# Patient Record
Sex: Female | Born: 1945
Health system: Southern US, Community
[De-identification: ages and names within clinical notes are randomized; demographics above are authoritative.]

## PROBLEM LIST (undated history)

## (undated) DIAGNOSIS — K219 Gastro-esophageal reflux disease without esophagitis: Secondary | ICD-10-CM

## (undated) DIAGNOSIS — M1990 Unspecified osteoarthritis, unspecified site: Secondary | ICD-10-CM

## (undated) DIAGNOSIS — M8580 Other specified disorders of bone density and structure, unspecified site: Secondary | ICD-10-CM

## (undated) DIAGNOSIS — H9312 Tinnitus, left ear: Secondary | ICD-10-CM

## (undated) HISTORY — DX: Other specified disorders of bone density and structure, unspecified site: M85.80

## (undated) HISTORY — DX: Gastro-esophageal reflux disease without esophagitis: K21.9

## (undated) HISTORY — PX: CHOLECYSTECTOMY: SHX55

## (undated) HISTORY — DX: Tinnitus, left ear: H93.12

---

## 1976-07-07 HISTORY — PX: GASTRIC BYPASS: SHX52

## 2005-01-22 ENCOUNTER — Emergency Department: Payer: Self-pay | Admitting: Emergency Medicine

## 2005-02-24 ENCOUNTER — Encounter: Payer: Self-pay | Admitting: Specialist

## 2005-03-07 ENCOUNTER — Encounter: Payer: Self-pay | Admitting: Specialist

## 2005-04-06 ENCOUNTER — Encounter: Payer: Self-pay | Admitting: Specialist

## 2011-06-25 HISTORY — PX: COLONOSCOPY: SHX174

## 2014-09-25 DIAGNOSIS — G8929 Other chronic pain: Secondary | ICD-10-CM | POA: Diagnosis not present

## 2014-09-25 DIAGNOSIS — M545 Low back pain: Secondary | ICD-10-CM | POA: Diagnosis not present

## 2016-07-15 LAB — HM COLONOSCOPY

## 2016-07-23 ENCOUNTER — Encounter: Payer: Self-pay | Admitting: Family Medicine

## 2016-08-13 ENCOUNTER — Ambulatory Visit (INDEPENDENT_AMBULATORY_CARE_PROVIDER_SITE_OTHER): Payer: Medicare HMO | Admitting: Family Medicine

## 2016-08-13 VITALS — BP 138/72 | HR 83 | Temp 98.7°F | Resp 16 | Ht 61.0 in | Wt 189.5 lb

## 2016-08-13 DIAGNOSIS — Z0000 Encounter for general adult medical examination without abnormal findings: Secondary | ICD-10-CM | POA: Diagnosis not present

## 2016-08-13 DIAGNOSIS — Z23 Encounter for immunization: Secondary | ICD-10-CM

## 2016-08-13 DIAGNOSIS — Z9884 Bariatric surgery status: Secondary | ICD-10-CM | POA: Diagnosis not present

## 2016-08-13 DIAGNOSIS — E559 Vitamin D deficiency, unspecified: Secondary | ICD-10-CM | POA: Diagnosis not present

## 2016-08-13 DIAGNOSIS — Z1159 Encounter for screening for other viral diseases: Secondary | ICD-10-CM | POA: Diagnosis not present

## 2016-08-13 DIAGNOSIS — Z8781 Personal history of (healed) traumatic fracture: Secondary | ICD-10-CM | POA: Insufficient documentation

## 2016-08-13 DIAGNOSIS — Z1231 Encounter for screening mammogram for malignant neoplasm of breast: Secondary | ICD-10-CM | POA: Diagnosis not present

## 2016-08-13 DIAGNOSIS — K219 Gastro-esophageal reflux disease without esophagitis: Secondary | ICD-10-CM | POA: Diagnosis not present

## 2016-08-13 DIAGNOSIS — E88819 Insulin resistance, unspecified: Secondary | ICD-10-CM | POA: Insufficient documentation

## 2016-08-13 DIAGNOSIS — M170 Bilateral primary osteoarthritis of knee: Secondary | ICD-10-CM

## 2016-08-13 DIAGNOSIS — H9312 Tinnitus, left ear: Secondary | ICD-10-CM | POA: Diagnosis not present

## 2016-08-13 DIAGNOSIS — Z1239 Encounter for other screening for malignant neoplasm of breast: Secondary | ICD-10-CM

## 2016-08-13 DIAGNOSIS — E8881 Metabolic syndrome: Secondary | ICD-10-CM | POA: Diagnosis not present

## 2016-08-13 DIAGNOSIS — E2839 Other primary ovarian failure: Secondary | ICD-10-CM | POA: Diagnosis not present

## 2016-08-13 MED ORDER — LANSOPRAZOLE 15 MG PO CPDR: 15.0000 mg | DELAYED_RELEASE_CAPSULE | Freq: Every day | ORAL | 0 refills | Status: DC

## 2016-08-13 NOTE — Progress Notes (Signed)
Name: Heather Norris   MRN: [id number]    DOB: 1945-10-07   Date:08/13/2016       Progress Note  Subjective  Chief Complaint  Chief Complaint  Patient presents with  . Annual Exam    HPI  Functional ability/safety issues: No Issues Hearing issues: Addressed  Activities of daily living: Discussed Home safety issues: No Issues  End Of Life Planning: Offered verbal information regarding advanced directives, healthcare power of attorney. She will get it done Preventative care, Health maintenance, Preventative health measures discussed.  Preventative screenings discussed today: lab work, colonoscopy,  mammogram, DEXA.  Low Dose CT Chest recommended if Age 38-80 years, 30 pack-year currently smoking OR have quit w/in 15years. She wants to think about it  Lifestyle risk factor issued reviewed: Diet, exercise, weight management, advised patient smoking is not healthy, nutrition/diet.  Preventative health measures discussed (5-10 year plan).  Reviewed and recommended vaccinations: - Pneumovax  - Prevnar  - Annual Influenza - Zostavax - Tdap   Depression screening: Done Fall risk screening: Done Discuss ADLs/IADLs: Done  Current medical providers: See HPI  Other health risk factors identified this visit: No other issues Cognitive impairment issues: None identified  All above discussed with patient. Appropriate education, counseling and referral will be made based upon the above.   Bariatric Surgery: many years ago, not taking any supplements, but feels well, we will check labs  GERD: doing well at this time, she denies heart burn or indigestion, also denies regurgitation. Discussed risk of long term use of PPI. She states she is currently only taking it prn   Tinnitus left ear: she denies hearing loss, but is unilateral, discussed referral to ENT and she is willing to go.   Patient Active Problem List   Diagnosis Date Noted  . GERD (gastroesophageal reflux disease)  08/13/2016  . Tinnitus of left ear 08/13/2016  . History of bariatric surgery 08/13/2016  . Vitamin D deficiency 08/13/2016  . History of shoulder fracture 08/13/2016  . Osteoarthritis of both knees 08/13/2016  . Insulin resistance 08/13/2016    Past Surgical History:  Procedure Laterality Date  . CHOLECYSTECTOMY    . COLONOSCOPY  06/25/2011  . GASTRIC BYPASS  1978    Family History  Problem Relation Age of Onset  . Adopted: Yes    Social History   Social History  . Marital status: Widowed    Spouse name: N/A  . Number of children: N/A  . Years of education: N/A   Occupational History  . Not on file.   Social History Main Topics  . Smoking status: Former Smoker    Packs/day: 2.00    Years: 55.00    Types: Cigarettes    Start date: 07/07/1956    Quit date: 12/06/2011  . Smokeless tobacco: Never Used     Comment: Patient has smoke off and on for 55 years  . Alcohol use No  . Drug use: No  . Sexual activity: Not Currently   Other Topics Concern  . Not on file   Social History Narrative  . No narrative on file     Current Outpatient Prescriptions:  .  lansoprazole (PREVACID) 15 MG capsule, Take 1 capsule (15 mg total) by mouth daily at 12 noon., Disp: 30 capsule, Rfl: 0  No Known Allergies   ROS  Constitutional: Negative for fever or significant  weight change.  Respiratory: Negative for cough and shortness of breath.   Cardiovascular: Negative for chest pain or palpitations.  Gastrointestinal: Negative for abdominal pain, no bowel changes.  Musculoskeletal: Negative for gait problem or  joint swelling.  Skin: Negative for rash.  Neurological: Negative for dizziness or headache.  No other specific complaints in a complete review of systems (except as listed in HPI above).  Objective  Vitals:   08/13/16 1043  BP: 138/72  Pulse: 83  Resp: 16  Temp: 98.7 F (37.1 C)  TempSrc: Oral  SpO2: 96%  Weight: 189 lb 8 oz (86 kg)  Height: 5\' 1"  (1.549 m)     Body mass index is 35.81 kg/m.  Physical Exam  Constitutional: Patient appears well-developed and obese. No distress.  HENT: Head: Normocephalic and atraumatic. Ears: B TMs ok, no erythema or effusion; Nose: Nose normal. Mouth/Throat: Oropharynx is clear and moist. No oropharyngeal exudate.  Eyes: Conjunctivae and EOM are normal. Pupils are equal, round, and reactive to light. No scleral icterus.  Neck: Normal range of motion. Neck supple. No JVD present. No thyromegaly present.  Cardiovascular: Normal rate, regular rhythm and normal heart sounds.  No murmur heard. Trace  BLE edema. Pulmonary/Chest: Effort normal and breath sounds normal. No respiratory distress. Abdominal: Soft. Bowel sounds are normal, no distension. There is no tenderness. no masses Breast: no lumps or masses, no nipple discharge or rashes FEMALE GENITALIA:  Not examed RECTAL not done Musculoskeletal: Decrease rom of right shoulder, no joint effusions. No gross deformities Neurological: he is alert and oriented to person, place, and time. No cranial nerve deficit. Coordination, balance, strength, speech and gait are normal.  Skin: Skin is warm and dry. No rash noted. No erythema.  Psychiatric: Patient has a normal mood and affect. behavior is normal. Judgment and thought content normal.   PHQ2/9: Depression screen PHQ 2/9 08/13/2016  Decreased Interest 0  Down, Depressed, Hopeless 0  PHQ - 2 Score 0     Fall Risk: Fall Risk  08/13/2016  Falls in the past year? No    Functional Status Survey: Is the patient deaf or have difficulty hearing?: No Does the patient have difficulty seeing, even when wearing glasses/contacts?: No Does the patient have difficulty concentrating, remembering, or making decisions?: No Does the patient have difficulty walking or climbing stairs?: No Does the patient have difficulty dressing or bathing?: No Does the patient have difficulty doing errands alone such as visiting a  doctor's office or shopping?: No   Assessment & Plan  1. Medicare annual wellness visit, subsequent  Discussed importance of 150 minutes of physical activity weekly, eat two servings of fish weekly, eat one serving of tree nuts ( cashews, pistachios, pecans, almonds.Marland Kitchen) every other day, eat 6 servings of fruit/vegetables daily and drink plenty of water and avoid sweet beverages.  - COMPLETE METABOLIC PANEL WITH GFR  2. Needs flu shot  refused  3. Gastroesophageal reflux disease without esophagitis  - lansoprazole (PREVACID) 15 MG capsule; Take 1 capsule (15 mg total) by mouth daily at 12 noon.  Dispense: 30 capsule; Refill: 0  4. History of bariatric surgery  - Vitamin B12 - CBC with Differential/Platelet - COMPLETE METABOLIC PANEL WITH GFR  5. Tinnitus of left ear  - Ambulatory referral to ENT  6. Vitamin D deficiency  - VITAMIN D 25 Hydroxy (Vit-D Deficiency, Fractures) - COMPLETE METABOLIC PANEL WITH GFR  7. History of shoulder fracture  Still has decrease rom of right shoulder but not pain   8. Primary osteoarthritis of both knees  Advised Tylenol prn   9. Insulin resistance  - Hemoglobin  A1c - Insulin, fasting - COMPLETE METABOLIC PANEL WITH GFR  10. Need for pneumococcal vaccination  - Pneumococcal conjugate vaccine 13-valent IM  11. Need for shingles vaccine  - Varicella-zoster vaccine subcutaneous  12. Encounter for screening breast examination  - mammogram   13. Ovarian failure  - DG Bone Density; Future  14. Need for hepatitis C screening test  - Hepatitis C antibody

## 2016-08-14 LAB — COMPLETE METABOLIC PANEL WITH GFR
ALT: 6 U/L (ref 6–29)
AST: 17 U/L (ref 10–35)
Albumin: 4.2 g/dL (ref 3.6–5.1)
Alkaline Phosphatase: 64 U/L (ref 33–130)
BUN: 13 mg/dL (ref 7–25)
CHLORIDE: 109 mmol/L (ref 98–110)
CO2: 23 mmol/L (ref 20–31)
Calcium: 9.2 mg/dL (ref 8.6–10.4)
Creat: 0.98 mg/dL — ABNORMAL HIGH (ref 0.60–0.93)
GFR, EST AFRICAN AMERICAN: 67 mL/min (ref >=60)
GFR, EST NON AFRICAN AMERICAN: 58 mL/min — AB (ref >=60)
Glucose, Bld: 101 mg/dL — ABNORMAL HIGH (ref 65–99)
Potassium: 4.1 mmol/L (ref 3.5–5.3)
Sodium: 142 mmol/L (ref 135–146)
Total Bilirubin: 0.7 mg/dL (ref 0.2–1.2)
Total Protein: 6.8 g/dL (ref 6.1–8.1)

## 2016-08-14 LAB — CBC WITH DIFFERENTIAL/PLATELET
BASOS PCT: 1 %
Basophils Absolute: 50 cells/uL (ref 0–200)
Eosinophils Absolute: 50 cells/uL (ref 15–500)
Eosinophils Relative: 1 %
HEMATOCRIT: 41.3 % (ref 35.0–45.0)
Hemoglobin: 13.5 g/dL (ref 11.7–15.5)
LYMPHS PCT: 32 %
Lymphs Abs: 1600 cells/uL (ref 850–3900)
MCH: 29 pg (ref 27.0–33.0)
MCHC: 32.7 g/dL (ref 32.0–36.0)
MCV: 88.6 fL (ref 80.0–100.0)
MONO ABS: 350 {cells}/uL (ref 200–950)
MPV: 10.3 fL (ref 7.5–12.5)
Monocytes Relative: 7 %
NEUTROS ABS: 2950 {cells}/uL (ref 1500–7800)
Neutrophils Relative %: 59 %
PLATELETS: 242 10*3/uL (ref 140–400)
RBC: 4.66 MIL/uL (ref 3.80–5.10)
RDW: 14.2 % (ref 11.0–15.0)
WBC: 5 10*3/uL (ref 3.8–10.8)

## 2016-08-14 LAB — INSULIN, FASTING: INSULIN FASTING, SERUM: 6.1 u[IU]/mL (ref 2.0–19.6)

## 2016-08-14 LAB — HEMOGLOBIN A1C
Hgb A1c MFr Bld: 5.4 % (ref <5.7)
MEAN PLASMA GLUCOSE: 108 mg/dL

## 2016-08-14 LAB — HEPATITIS C ANTIBODY: HCV Ab: REACTIVE — AB

## 2016-08-14 LAB — VITAMIN B12: Vitamin B-12: 273 pg/mL (ref 200–1100)

## 2016-08-14 LAB — VITAMIN D 25 HYDROXY (VIT D DEFICIENCY, FRACTURES): Vit D, 25-Hydroxy: 21 ng/mL — ABNORMAL LOW (ref 30–100)

## 2016-08-15 ENCOUNTER — Other Ambulatory Visit: Payer: Self-pay | Admitting: Family Medicine

## 2016-08-15 DIAGNOSIS — R799 Abnormal finding of blood chemistry, unspecified: Secondary | ICD-10-CM

## 2016-08-18 ENCOUNTER — Encounter: Payer: Self-pay | Admitting: Family Medicine

## 2016-08-20 LAB — HEPATITIS C VRS RNA DETECT BY PCR-QUAL: Hepatitis C Vrs RNA by PCR-Qual: NOT DETECTED

## 2016-08-21 LAB — HEPATITIS C RNA QUANTITATIVE
HCV Quantitative Log: <1.18 Log IU/mL
HCV Quantitative: <15 IU/mL

## 2016-09-02 DIAGNOSIS — H9319 Tinnitus, unspecified ear: Secondary | ICD-10-CM | POA: Diagnosis not present

## 2016-09-02 DIAGNOSIS — H903 Sensorineural hearing loss, bilateral: Secondary | ICD-10-CM | POA: Diagnosis not present

## 2016-09-23 ENCOUNTER — Ambulatory Visit
Admission: RE | Admit: 2016-09-23 | Discharge: 2016-09-23 | Disposition: A | Discharge status: Home / Self Care | Payer: Medicare HMO | Source: Ambulatory Visit | Attending: Family Medicine | Admitting: Family Medicine

## 2016-09-23 DIAGNOSIS — M8588 Other specified disorders of bone density and structure, other site: Secondary | ICD-10-CM | POA: Diagnosis not present

## 2016-09-23 DIAGNOSIS — Z1231 Encounter for screening mammogram for malignant neoplasm of breast: Secondary | ICD-10-CM | POA: Diagnosis not present

## 2016-09-23 DIAGNOSIS — Z1239 Encounter for other screening for malignant neoplasm of breast: Secondary | ICD-10-CM

## 2016-09-23 DIAGNOSIS — M85852 Other specified disorders of bone density and structure, left thigh: Secondary | ICD-10-CM | POA: Diagnosis not present

## 2016-09-23 DIAGNOSIS — E2839 Other primary ovarian failure: Secondary | ICD-10-CM

## 2016-09-25 ENCOUNTER — Encounter: Payer: Self-pay | Admitting: Family Medicine

## 2016-09-25 DIAGNOSIS — M8580 Other specified disorders of bone density and structure, unspecified site: Secondary | ICD-10-CM | POA: Insufficient documentation

## 2017-08-11 ENCOUNTER — Ambulatory Visit (INDEPENDENT_AMBULATORY_CARE_PROVIDER_SITE_OTHER): Payer: Medicare HMO

## 2017-08-11 VITALS — BP 138/88 | HR 74 | Temp 98.2°F | Resp 12 | Ht 61.0 in | Wt 180.2 lb

## 2017-08-11 DIAGNOSIS — Z1231 Encounter for screening mammogram for malignant neoplasm of breast: Secondary | ICD-10-CM

## 2017-08-11 DIAGNOSIS — Z1239 Encounter for other screening for malignant neoplasm of breast: Secondary | ICD-10-CM

## 2017-08-11 DIAGNOSIS — Z0000 Encounter for general adult medical examination without abnormal findings: Secondary | ICD-10-CM | POA: Diagnosis not present

## 2017-08-11 NOTE — Progress Notes (Signed)
Subjective:   Heather Norris is a 72 y.o. female who presents for Medicare Annual (Subsequent) preventive examination.  Review of Systems:  N/A Cardiac Risk Factors include: advanced age (>39men, >82 women);sedentary lifestyle;obesity (BMI >30kg/m2)     Objective:     Vitals: BP 138/88 (BP Location: Left Arm, Patient Position: Sitting, Cuff Size: Large)   Pulse 74   Temp 98.2 F (36.8 C) (Oral)   Resp 12   Ht 5\' 1"  (1.549 m)   Wt 180 lb 3.2 oz (81.7 kg)   BMI 34.05 kg/m   Body mass index is 34.05 kg/m.  Advanced Directives 08/11/2017 08/13/2016  Does Patient Have a Medical Advance Directive? No No  Would patient like information on creating a medical advance directive? Yes (MAU/Ambulatory/Procedural Areas - Information given) -   Pt has been provided with the "MOST" and "DNR" documents for her review. Pt has been advised that she will only need to complete the document that is most appropriate to suit her health care wishes. Once completed, pt has been advised to return the appropriate document to the office for the physician to review and sign. Verbalized acceptance and understanding.  Tobacco Social History   Tobacco Use  Smoking Status Former Smoker  . Packs/day: 2.00  . Years: 55.00  . Pack years: 110.00  . Types: Cigarettes  . Start date: 07/07/1956  . Last attempt to quit: 12/06/2011  . Years since quitting: 5.6  Smokeless Tobacco Never Used  Tobacco Comment   smoking cessation materials not required     Counseling given: No Comment: smoking cessation materials not required   Clinical Intake:  Pre-visit preparation completed: Yes  Pain : No/denies pain   BMI - recorded: 34.05 Nutritional Status: BMI > 30  Obese Nutritional Risks: None Diabetes: No  How often do you need to have someone help you when you read instructions, pamphlets, or other written materials from your doctor or pharmacy?: 1 - Never  Interpreter Needed?: No  Information entered by  :: AEversole, LPN  Past Medical History:  Diagnosis Date  . GERD (gastroesophageal reflux disease)   . Tinnitus of left ear    Past Surgical History:  Procedure Laterality Date  . CHOLECYSTECTOMY    . COLONOSCOPY  06/25/2011  . GASTRIC BYPASS  1978   Family History  Adopted: Yes  Problem Relation Age of Onset  . Mental illness Daughter        anxiety    Social History   Socioeconomic History  . Marital status: Widowed    Spouse name: Heather Norris  . Number of children: 1  . Years of education: None  . Highest education level: 7th grade  Social Needs  . Financial resource strain: Not hard at all  . Food insecurity - worry: Never true  . Food insecurity - inability: Never true  . Transportation needs - medical: No  . Transportation needs - non-medical: No  Occupational History  . Occupation: Retired  Tobacco Use  . Smoking status: Former Smoker    Packs/day: 2.00    Years: 55.00    Pack years: 110.00    Types: Cigarettes    Start date: 07/07/1956    Last attempt to quit: 12/06/2011    Years since quitting: 5.6  . Smokeless tobacco: Never Used  . Tobacco comment: smoking cessation materials not required  Substance and Sexual Activity  . Alcohol use: No  . Drug use: No  . Sexual activity: Not Currently  Other Topics Concern  .  None  Social History Narrative  . None    Outpatient Encounter Medications as of 08/11/2017  Medication Sig  . lansoprazole (PREVACID) 15 MG capsule Take 1 capsule (15 mg total) by mouth daily at 12 noon.   No facility-administered encounter medications on file as of 08/11/2017.     Activities of Daily Living In your present state of health, do you have any difficulty performing the following activities: 08/11/2017 08/13/2016  Hearing? Y N  Comment Tinnitus in L ear; denies wearing hearing aids -  Vision? N N  Comment wears eyeglasses -  Difficulty concentrating or making decisions? N N  Walking or climbing stairs? Y N  Comment joint pain -    Dressing or bathing? N N  Doing errands, shopping? N N  Preparing Food and eating ? N -  Comment denies wearing dentures -  Using the Toilet? N -  In the past six months, have you accidently leaked urine? N -  Do you have problems with loss of bowel control? N -  Managing your Medications? N -  Managing your Finances? N -  Housekeeping or managing your Housekeeping? N -  Some recent data might be hidden    Patient Care Team: Heather Sizer, MD as PCP - General (Family Medicine)    Assessment:   This is a routine wellness examination for [name].  Exercise Activities and Dietary recommendations Current Exercise Habits: The patient does not participate in regular exercise at present, Exercise limited by: None identified  Goals    . DIET - INCREASE WATER INTAKE     Recommend to drink at least 6-8 8oz glasses of water per day.       Fall Risk Fall Risk  08/11/2017 08/13/2016  Falls in the past year? No No   Is the patient's home free of loose throw rugs in walkways, pet beds, electrical cords, etc?   Yes Does the patient have any grab bars in the bathroom? No  Does the patient use a shower chair when bathing? No Does the patient have any stairs in or around the home? No If so, are there any handrails?  N/A, has a ramp Does the patient have adequate lighting?  Yes Does the patient use a cane, walker or w/c? No Does the patient use of an elevated toilet seat? No  Timed Get Up and Go Performed: Yes. Pt ambulated 10 feet within 12 sec. Gait stead-fast and without the use of an assistive device. No intervention required at this time. Fall risk prevention has been discussed.  Pt declined my offer to send Community Resource Referral to Care Guide for installation of grab bars in the shower, shower chair or an elevated toilet seat.  Depression Screen PHQ 2/9 Scores 08/11/2017 08/13/2016  PHQ - 2 Score 0 0     Cognitive Function     6CIT Screen 08/11/2017  What Year? 0 points  What  month? 0 points  What time? 0 points  Count back from 20 0 points  Months in reverse 2 points  Repeat phrase 4 points  Total Score 6    Immunization History  Administered Date(s) Administered  . Pneumococcal Conjugate-13 08/13/2016  . Pneumococcal-Unspecified 06/11/2011  . Td 06/11/2011    Qualifies for Shingles Vaccine? Yes. Due for Zostavax or Shingrix vaccine. Education has been provided regarding the importance of this vaccine. Pt has been advised to call her insurance company to determine her out of pocket expense. Advised she may also receive this vaccine  at his/her local pharmacy or Health Dept. Verbalized acceptance and understanding.  Due for Flu vaccine. Declined my offer to administer today. Education has been provided regarding the importance of this vaccine but still declined. Pt has been advised to call our office if she should change her mind and decide to receive this vaccine. Also advised she may also receive this vaccine at her local pharmacy or Health Dept. Verbalized acceptance and understanding.  Screening Tests Health Maintenance  Topic Date Due  . INFLUENZA VACCINE  04/13/2018 (Originally 02/04/2017)  . MAMMOGRAM  09/23/2017  . TETANUS/TDAP  06/10/2021  . COLONOSCOPY  07/15/2026  . DEXA SCAN  Completed  . Hepatitis C Screening  Completed  . PNA vac Low Risk Adult  Completed    Cancer Screenings: Lung: Low Dose CT Chest recommended if Age 18-80 years, 30 pack-year currently smoking OR have quit w/in 15years. Patient does qualify. An Epic message has been sent to Burgess Estelle, RN (Oncology Nurse Navigator) regarding the possible need for this exam. Raquel Sarna will review the patient's chart and review all previous imaging, then will collaborate with radiology to determine if the patient truly qualifies for the exam. If the patient qualifies, Raquel Sarna will order the Low Dose CT of the chest and will call the patient to schedule the exam. Breast:  Up to date on Mammogram?  Yes. Completed 09/23/16. Repeat every year. Ordered today. The patient has been provided with contact information to schedule her own appointment. Advised to schedule on or after 09/23/17. Verbalized acceptance and understanding. Up to date of Bone Density/Dexa? Yes. Completed 09/23/16. Osteoporotic screenings no longer required. Colorectal: Completed colonoscopy 07/15/16. Repeat every 10 years  Additional Screenings: Hepatitis B/HIV/Syphillis: Does not qualify Hepatitis C Screening: Completed 08/13/16      Plan:  I have personally reviewed and addressed the Medicare Annual Wellness questionnaire and have noted the following in the patient's chart:  A. Medical and social history B. Use of alcohol, tobacco or illicit drugs  C. Current medications and supplements D. Functional ability and status E.  Nutritional status F.  Physical activity G. Advance directives H. List of other physicians I.  Hospitalizations, surgeries, and ER visits in previous 12 months J.  [place] such as hearing and vision if needed, cognitive and depression L. Referrals and appointments - none  In addition, I have reviewed and discussed with patient certain preventive protocols, quality metrics, and best practice recommendations. A written personalized care plan for preventive services as well as general preventive health recommendations were provided to patient.  Signed,  Aleatha Borer, LPN Nurse Health Advisor  I have reviewed this encounter including the documentation in this note and/or discussed this patient with the provider, Aleatha Borer, LPN. I am certifying that I agree with the content of this note as supervising physician.  Heather Sizer, MD [facility] 08/12/2017, 3:15 PM

## 2017-08-11 NOTE — Patient Instructions (Signed)
[name] , Thank you for taking time to come for your Medicare Wellness Visit. I appreciate your ongoing commitment to your health goals. Please review the following plan we discussed and let me know if I can assist you in the future.   Screening recommendations/referrals: Colorectal Screening: Completed colonoscopy 07/15/16. Repeat every 10 years Mammogram: Completed 09/23/16. Repeat every year.  Bone Density: Completed 09/23/16. Osteoporotic screenings no longer required. Lung Cancer Screening: You will receive a call from Burgess Estelle, RN to schedule your CT scan. Hepatitis C Screening: Completed 08/13/16 HIV/Syphilis/Hepatitis B Screening: You do not qualify for this screening   Vision/Dental Exams: Recommended yearly ophthalmology/optometry visit for glaucoma screening and checkup Recommended yearly dental visit for hygiene and checkup  Vaccinations: Influenza vaccine: Declined Pneumococcal vaccine: Completed PCV13 08/13/16 and PPSV23 06/11/11 Tdap vaccine: Completed 06/11/11 Shingles vaccine: Please call your insurance company to determine your out of pocket expense for the Shingrix vaccine. You may also receive this vaccine at your local pharmacy or Health Dept.  Advanced directives: Advance directive discussed with you today. I have provided a copy for you to complete at home and have notarized. Once this is complete please bring a copy in to our office so we can scan it into your chart.  Conditions/risks identified: Recommend to drink at least 6-8 8oz glasses of water per day.  Next appointment: You are scheduled to see Dr. Ancil Boozer on 08/17/17 @ 11:00am.   Please schedule your Annual Wellness Visit with your Nurse Health Advisor in one year.  Preventive Care 74 Years and Older, Female Preventive care refers to lifestyle choices and visits with your health care provider that can promote health and wellness. What does preventive care include?  A yearly physical exam. This is also  called an annual well check.  Dental exams once or twice a year.  Routine eye exams. Ask your health care provider how often you should have your eyes checked.  Personal lifestyle choices, including:  Daily care of your teeth and gums.  Regular physical activity.  Eating a healthy diet.  Avoiding tobacco and drug use.  Limiting alcohol use.  Practicing safe sex.  Taking low-dose aspirin every day.  Taking vitamin and mineral supplements as recommended by your health care provider. What happens during an annual well check? The services and screenings done by your health care provider during your annual well check will depend on your age, overall health, lifestyle risk factors, and family history of disease. Counseling  Your health care provider may ask you questions about your:  Alcohol use.  Tobacco use.  Drug use.  Emotional well-being.  Home and relationship well-being.  Sexual activity.  Eating habits.  History of falls.  Memory and ability to understand (cognition).  Work and work Statistician.  Reproductive health. Screening  You may have the following tests or measurements:  Height, weight, and BMI.  Blood pressure.  Lipid and cholesterol levels. These may be checked every 5 years, or more frequently if you are over 62 years old.  Skin check.  Lung cancer screening. You may have this screening every year starting at age 74 if you have a 30-pack-year history of smoking and currently smoke or have quit within the past 15 years.  Fecal occult blood test (FOBT) of the stool. You may have this test every year starting at age 74.  Flexible sigmoidoscopy or colonoscopy. You may have a sigmoidoscopy every 5 years or a colonoscopy every 10 years starting at age 74.  Hepatitis  C blood test.  Hepatitis B blood test.  Sexually transmitted disease (STD) testing.  Diabetes screening. This is done by checking your blood sugar (glucose) after you have not  eaten for a while (fasting). You may have this done every 1-3 years.  Bone density scan. This is done to screen for osteoporosis. You may have this done starting at age 74.  Mammogram. This may be done every 1-2 years. Talk to your health care provider about how often you should have regular mammograms. Talk with your health care provider about your test results, treatment options, and if necessary, the need for more tests. Vaccines  Your health care provider may recommend certain vaccines, such as:  Influenza vaccine. This is recommended every year.  Tetanus, diphtheria, and acellular pertussis (Tdap, Td) vaccine. You may need a Td booster every 10 years.  Zoster vaccine. You may need this after age 74.  Pneumococcal 13-valent conjugate (PCV13) vaccine. One dose is recommended after age 74.  Pneumococcal polysaccharide (PPSV23) vaccine. One dose is recommended after age 74. Talk to your health care provider about which screenings and vaccines you need and how often you need them. This information is not intended to replace advice given to you by your health care provider. Make sure you discuss any questions you have with your health care provider. Document Released: 07/20/2015 Document Revised: 03/12/2016 Document Reviewed: 04/24/2015 Elsevier Interactive Patient Education  2017 [place] Prevention in the Home Falls can cause injuries. They can happen to people of all ages. There are many things you can do to make your home safe and to help prevent falls. What can I do on the outside of my home?  Regularly fix the edges of walkways and driveways and fix any cracks.  Remove anything that might make you trip as you walk through a door, such as a raised step or threshold.  Trim any bushes or trees on the path to your home.  Use bright outdoor lighting.  Clear any walking paths of anything that might make someone trip, such as rocks or tools.  Regularly check to see if  handrails are loose or broken. Make sure that both sides of any steps have handrails.  Any raised decks and porches should have guardrails on the edges.  Have any leaves, snow, or ice cleared regularly.  Use sand or salt on walking paths during winter.  Clean up any spills in your garage right away. This includes oil or grease spills. What can I do in the bathroom?  Use night lights.  Install grab bars by the toilet and in the tub and shower. Do not use towel bars as grab bars.  Use non-skid mats or decals in the tub or shower.  If you need to sit down in the shower, use a plastic, non-slip stool.  Keep the floor dry. Clean up any water that spills on the floor as soon as it happens.  Remove soap buildup in the tub or shower regularly.  Attach bath mats securely with double-sided non-slip rug tape.  Do not have throw rugs and other things on the floor that can make you trip. What can I do in the bedroom?  Use night lights.  Make sure that you have a light by your bed that is easy to reach.  Do not use any sheets or blankets that are too big for your bed. They should not hang down onto the floor.  Have a firm chair that has side arms.  You can use this for support while you get dressed.  Do not have throw rugs and other things on the floor that can make you trip. What can I do in the kitchen?  Clean up any spills right away.  Avoid walking on wet floors.  Keep items that you use a lot in easy-to-reach places.  If you need to reach something above you, use a strong step stool that has a grab bar.  Keep electrical cords out of the way.  Do not use floor polish or wax that makes floors slippery. If you must use wax, use non-skid floor wax.  Do not have throw rugs and other things on the floor that can make you trip. What can I do with my stairs?  Do not leave any items on the stairs.  Make sure that there are handrails on both sides of the stairs and use them. Fix  handrails that are broken or loose. Make sure that handrails are as long as the stairways.  Check any carpeting to make sure that it is firmly attached to the stairs. Fix any carpet that is loose or worn.  Avoid having throw rugs at the top or bottom of the stairs. If you do have throw rugs, attach them to the floor with carpet tape.  Make sure that you have a light switch at the top of the stairs and the bottom of the stairs. If you do not have them, ask someone to add them for you. What else can I do to help prevent falls?  Wear shoes that:  Do not have high heels.  Have rubber bottoms.  Are comfortable and fit you well.  Are closed at the toe. Do not wear sandals.  If you use a stepladder:  Make sure that it is fully opened. Do not climb a closed stepladder.  Make sure that both sides of the stepladder are locked into place.  Ask someone to hold it for you, if possible.  Clearly mark and make sure that you can see:  Any grab bars or handrails.  First and last steps.  Where the edge of each step is.  Use tools that help you move around (mobility aids) if they are needed. These include:  Canes.  Walkers.  Scooters.  Crutches.  Turn on the lights when you go into a dark area. Replace any light bulbs as soon as they burn out.  Set up your furniture so you have a clear path. Avoid moving your furniture around.  If any of your floors are uneven, fix them.  If there are any pets around you, be aware of where they are.  Review your medicines with your doctor. Some medicines can make you feel dizzy. This can increase your chance of falling. Ask your doctor what other things that you can do to help prevent falls. This information is not intended to replace advice given to you by your health care provider. Make sure you discuss any questions you have with your health care provider. Document Released: 04/19/2009 Document Revised: 11/29/2015 Document Reviewed:  07/28/2014 Elsevier Interactive Patient Education  2017 [employer].

## 2017-08-12 ENCOUNTER — Telehealth: Payer: Self-pay | Admitting: *Deleted

## 2017-08-12 NOTE — Telephone Encounter (Signed)
Received referral for low dose lung cancer screening CT scan. Attempted to leave Message at phone number listed in EMR for patient to call me back to facilitate scheduling scan. However, this option is not available. I will attempt to contact her at a later date.

## 2017-08-17 ENCOUNTER — Encounter: Payer: Self-pay | Admitting: Family Medicine

## 2017-08-17 ENCOUNTER — Ambulatory Visit (INDEPENDENT_AMBULATORY_CARE_PROVIDER_SITE_OTHER): Payer: Medicare HMO | Admitting: Family Medicine

## 2017-08-17 ENCOUNTER — Telehealth: Payer: Self-pay | Admitting: *Deleted

## 2017-08-17 VITALS — BP 118/84 | HR 84 | Temp 97.8°F | Resp 18 | Ht 61.88 in | Wt 176.3 lb

## 2017-08-17 DIAGNOSIS — Z9884 Bariatric surgery status: Secondary | ICD-10-CM | POA: Diagnosis not present

## 2017-08-17 DIAGNOSIS — Z122 Encounter for screening for malignant neoplasm of respiratory organs: Secondary | ICD-10-CM

## 2017-08-17 DIAGNOSIS — Z87891 Personal history of nicotine dependence: Secondary | ICD-10-CM

## 2017-08-17 DIAGNOSIS — Z1231 Encounter for screening mammogram for malignant neoplasm of breast: Secondary | ICD-10-CM

## 2017-08-17 DIAGNOSIS — M8580 Other specified disorders of bone density and structure, unspecified site: Secondary | ICD-10-CM | POA: Diagnosis not present

## 2017-08-17 DIAGNOSIS — Z01419 Encounter for gynecological examination (general) (routine) without abnormal findings: Secondary | ICD-10-CM | POA: Diagnosis not present

## 2017-08-17 DIAGNOSIS — E559 Vitamin D deficiency, unspecified: Secondary | ICD-10-CM

## 2017-08-17 DIAGNOSIS — Z1239 Encounter for other screening for malignant neoplasm of breast: Secondary | ICD-10-CM

## 2017-08-17 DIAGNOSIS — R202 Paresthesia of skin: Secondary | ICD-10-CM | POA: Diagnosis not present

## 2017-08-17 MED ORDER — MULTI-VITAMIN/MINERALS PO TABS: 1.0000 | ORAL_TABLET | Freq: Every day | ORAL | 0 refills | Status: DC

## 2017-08-17 MED ORDER — ALENDRONATE SODIUM 70 MG PO TABS: 70.0000 mg | ORAL_TABLET | ORAL | 3 refills | Status: DC

## 2017-08-17 MED ORDER — CALCIUM CARBONATE-VITAMIN D 500-200 MG-UNIT PO TABS: 1.0000 | ORAL_TABLET | Freq: Two times a day (BID) | ORAL | 0 refills | Status: DC

## 2017-08-17 NOTE — Progress Notes (Signed)
Name: Heather Norris   MRN: [id number]    DOB: 1945/11/09   Date:08/17/2017       Progress Note  Subjective  Chief Complaint  Chief Complaint  Patient presents with  . Medicare Wellness    HPI   Patient presents for annual CPE and follow up  She is healthy and feels well, except for paresthesia on both feet, initially only on toes, but is progressing to mid foot, constant , it feels like she has a tight pair of socks on. No rashes. She had bariatric surgery many years ago, not taking any supplements. Explained that it may be B12 deficiency, we will check labs. Advised to start taking at least a multi vitamin daily .   Osteopenia: with a very high FRAX score, discussed medications and she agrees in starting, discussed options, and she would like oral medication for now  USPSTF grade A and B recommendations  Depression:  Depression screen [facility] 2/9 08/17/2017 08/11/2017 08/13/2016  Decreased Interest 0 0 0  Down, Depressed, Hopeless 0 0 0  PHQ - 2 Score 0 0 0   Hypertension: BP Readings from Last 3 Encounters:  08/17/17 118/84  08/11/17 138/88  08/13/16 138/72   Obesity: Wt Readings from Last 3 Encounters:  08/17/17 176 lb 4.8 oz (80 kg)  08/11/17 180 lb 3.2 oz (81.7 kg)  08/13/16 189 lb 8 oz (86 kg)   BMI Readings from Last 3 Encounters:  08/17/17 32.38 kg/m  08/11/17 34.05 kg/m  08/13/16 35.81 kg/m     Sexual History/Pain during Intercourse: not in many years Menstrual History/LMP/Abnormal Bleeding: not sexually active and denies post-menopausal bleeding Incontinence Symptoms: none   Advanced Care Planning: A voluntary discussion about advance care planning including the explanation and discussion of advance directives.  Discussed health care proxy and Living will, and the patient was able to identify a health care proxy as daughter   Patient does not have a living will at present time. If patient does have living will, I have requested they bring this to the clinic to  be scanned in to their chart.  Breast cancer:  She would like to have it every other year Cervical cancer screening:not a candidate  Osteoporosis:  Advised medication but she refuses  Lipids:  No results found for: CHOL No results found for: HDL No results found for: LDLCALC No results found for: TRIG No results found for: CHOLHDL No results found for: LDLDIRECT  Glucose:  Glucose, Bld  Date Value Ref Range Status  08/13/2016 101 (H) 65 - 99 mg/dL Final    Skin cancer: no change in lesions Colorectal cancer: 2018 Lung cancer:  Low Dose CT Chest recommended if Age 80-80 years, 30 pack-year currently smoking OR have quit w/in 15years. Patient does  qualify.  She is not interested in having study done, would not seek therapy  Aspirin:not currently  ECG: today    Patient Active Problem List   Diagnosis Date Noted  . Osteopenia 09/25/2016  . GERD (gastroesophageal reflux disease) 08/13/2016  . Tinnitus of left ear 08/13/2016  . History of bariatric surgery 08/13/2016  . Vitamin D deficiency 08/13/2016  . History of shoulder fracture 08/13/2016  . Osteoarthritis of both knees 08/13/2016  . Insulin resistance 08/13/2016    Past Surgical History:  Procedure Laterality Date  . CHOLECYSTECTOMY    . COLONOSCOPY  06/25/2011  . GASTRIC BYPASS  1978    Family History  Adopted: Yes  Problem Relation Age of Onset  .  Mental illness Daughter        anxiety     Social History   Socioeconomic History  . Marital status: Widowed    Spouse name: Iona Beard  . Number of children: 1  . Years of education: Not on file  . Highest education level: 7th grade  Social Needs  . Financial resource strain: Not hard at all  . Food insecurity - worry: Never true  . Food insecurity - inability: Never true  . Transportation needs - medical: No  . Transportation needs - non-medical: No  Occupational History  . Occupation: Retired  Tobacco Use  . Smoking status: Former Smoker     Packs/day: 2.00    Years: 55.00    Pack years: 110.00    Types: Cigarettes    Start date: 07/07/1956    Last attempt to quit: 12/06/2011    Years since quitting: 5.7  . Smokeless tobacco: Never Used  Substance and Sexual Activity  . Alcohol use: No  . Drug use: No  . Sexual activity: Not Currently  Other Topics Concern  . Not on file  Social History Narrative   She has been a widow since 1989   She has not dated since.    She has one child, [name].     Current Outpatient Medications:  .  lansoprazole (PREVACID) 15 MG capsule, Take 1 capsule (15 mg total) by mouth daily at 12 noon., Disp: 30 capsule, Rfl: 0  No Known Allergies   ROS   Constitutional: Negative for fever or weight change.  Respiratory: Negative for cough and shortness of breath.   Cardiovascular: Negative for chest pain or palpitations.  Gastrointestinal: Negative for abdominal pain, no bowel changes.  Musculoskeletal: Negative for gait problem or joint swelling.  Skin: Negative for rash.  Neurological: Negative for dizziness or headache.  No other specific complaints in a complete review of systems (except as listed in HPI above).   Objective  Vitals:   08/17/17 1127  BP: 118/84  Pulse: 84  Resp: 18  Temp: 97.8 F (36.6 C)  TempSrc: Oral  SpO2: 99%  Weight: 176 lb 4.8 oz (80 kg)  Height: 5' 1.88" (1.572 m)    Body mass index is 32.38 kg/m.  Physical Exam  Constitutional: Patient appears well-developed and well-nourished. No distress.  HENT: Head: Normocephalic and atraumatic. Ears: B TMs ok, no erythema or effusion; Nose: Nose normal. Mouth/Throat: Oropharynx is clear and moist. No oropharyngeal exudate.  Eyes: Conjunctivae and EOM are normal. Pupils are equal, round, and reactive to light. No scleral icterus.  Neck: Normal range of motion. Neck supple. No JVD present. No thyromegaly present.  Cardiovascular: Normal rate, regular rhythm and normal heart sounds.  No murmur heard. No BLE  edema. Pulmonary/Chest: Effort normal and breath sounds normal. No respiratory distress. Abdominal: Soft. Bowel sounds are normal, no distension. There is no tenderness. no masses Breast: no lumps or masses, no nipple discharge or rashes FEMALE GENITALIA:  External genitalia normal External urethra normal RECTAL: not done Musculoskeletal: Normal range of motion, no joint effusions. No gross deformities Neurological: he is alert and oriented to person, place, and time. No cranial nerve deficit. Coordination, balance, strength, speech and gait are normal.  Skin: Skin is warm and dry. No rash noted. No erythema.  Psychiatric: Patient has a normal mood and affect. behavior is normal. Judgment and thought content normal.    PHQ2/9: Depression screen [facility] 2/9 08/17/2017 08/11/2017 08/13/2016  Decreased Interest 0 0 0  Down,  Depressed, Hopeless 0 0 0  PHQ - 2 Score 0 0 0    Fall Risk: Fall Risk  08/17/2017 08/11/2017 08/13/2016  Falls in the past year? No No No    Functional Status Survey: Is the patient deaf or have difficulty hearing?: No Does the patient have difficulty seeing, even when wearing glasses/contacts?: No Does the patient have difficulty concentrating, remembering, or making decisions?: No Does the patient have difficulty walking or climbing stairs?: No Does the patient have difficulty dressing or bathing?: No Does the patient have difficulty doing errands alone such as visiting a doctor's office or shopping?: No   Assessment & Plan  1. Well woman exam  Discussed importance of 150 minutes of physical activity weekly, eat two servings of fish weekly, eat one serving of tree nuts ( cashews, pistachios, pecans, almonds.Marland Kitchen) every other day, eat 6 servings of fruit/vegetables daily and drink plenty of water and avoid sweet beverages.  EKG: normal EKG  2. Breast cancer screening  She will schedule it for next year  3. History of bariatric surgery  Check labs  4. Osteopenia,  unspecified location  - CBC with Differential/Platelet - VITAMIN D 25 Hydroxy (Vit-D Deficiency, Fractures) - Comprehensive metabolic panel - Pth  We will start fosamax today, discussed importance of taking on empty stomach and sit up or stand up for at least 30 minutes after taking medication , needs to take calcium and vitamin D   5. Vitamin D deficiency  - VITAMIN D 25 Hydroxy (Vit-D Deficiency, Fractures)  6. Paresthesia of both feet  - Vitamin B12 - Comprehensive metabolic panel

## 2017-08-17 NOTE — Telephone Encounter (Signed)
Received referral for initial lung cancer screening scan. Contacted patient and obtained smoking history,(former, quit 2013, 60 pack year) as well as answering questions related to screening process. Patient denies signs of lung cancer such as weight loss or hemoptysis. Patient denies comorbidity that would prevent curative treatment if lung cancer were found. Patient is scheduled for shared decision making visit and CT scan on 09/10/17.

## 2017-08-18 LAB — COMPREHENSIVE METABOLIC PANEL
AG Ratio: 1.6 (calc) (ref 1.0–2.5)
ALBUMIN MSPROF: 4.2 g/dL (ref 3.6–5.1)
ALKALINE PHOSPHATASE (APISO): 64 U/L (ref 33–130)
ALT: 8 U/L (ref 6–29)
AST: 14 U/L (ref 10–35)
BUN: 17 mg/dL (ref 7–25)
CHLORIDE: 107 mmol/L (ref 98–110)
CO2: 25 mmol/L (ref 20–32)
CREATININE: 0.74 mg/dL (ref 0.60–0.93)
Calcium: 9.8 mg/dL (ref 8.6–10.4)
GLOBULIN: 2.6 g/dL (ref 1.9–3.7)
Glucose, Bld: 106 mg/dL — ABNORMAL HIGH (ref 65–99)
POTASSIUM: 4.4 mmol/L (ref 3.5–5.3)
SODIUM: 139 mmol/L (ref 135–146)
TOTAL PROTEIN: 6.8 g/dL (ref 6.1–8.1)
Total Bilirubin: 0.6 mg/dL (ref 0.2–1.2)

## 2017-08-18 LAB — CBC WITH DIFFERENTIAL/PLATELET
BASOS PCT: 0.8 %
Basophils Absolute: 38 cells/uL (ref 0–200)
EOS PCT: 1.3 %
Eosinophils Absolute: 61 cells/uL (ref 15–500)
HCT: 41.4 % (ref 35.0–45.0)
Hemoglobin: 13.8 g/dL (ref 11.7–15.5)
LYMPHS ABS: 1556 {cells}/uL (ref 850–3900)
MCH: 28.1 pg (ref 27.0–33.0)
MCHC: 33.3 g/dL (ref 32.0–36.0)
MCV: 84.3 fL (ref 80.0–100.0)
MPV: 10.9 fL (ref 7.5–12.5)
Monocytes Relative: 6.6 %
Neutro Abs: 2735 cells/uL (ref 1500–7800)
Neutrophils Relative %: 58.2 %
PLATELETS: 237 10*3/uL (ref 140–400)
RBC: 4.91 10*6/uL (ref 3.80–5.10)
RDW: 13 % (ref 11.0–15.0)
TOTAL LYMPHOCYTE: 33.1 %
WBC: 4.7 10*3/uL (ref 3.8–10.8)
WBCMIX: 310 {cells}/uL (ref 200–950)

## 2017-08-18 LAB — PARATHYROID HORMONE, INTACT (NO CA): PTH: 86 pg/mL — ABNORMAL HIGH (ref 14–64)

## 2017-08-18 LAB — VITAMIN D 25 HYDROXY (VIT D DEFICIENCY, FRACTURES): VIT D 25 HYDROXY: 16 ng/mL — AB (ref 30–100)

## 2017-08-18 LAB — VITAMIN B12: Vitamin B-12: 342 pg/mL (ref 200–1100)

## 2017-08-21 ENCOUNTER — Other Ambulatory Visit: Payer: Self-pay | Admitting: Family Medicine

## 2017-08-21 DIAGNOSIS — M8580 Other specified disorders of bone density and structure, unspecified site: Secondary | ICD-10-CM

## 2017-08-21 DIAGNOSIS — R7989 Other specified abnormal findings of blood chemistry: Secondary | ICD-10-CM

## 2017-08-21 MED ORDER — VITAMIN D (ERGOCALCIFEROL) 1.25 MG (50000 UNIT) PO CAPS: 50000.0000 [IU] | ORAL_CAPSULE | ORAL | 0 refills | Status: DC

## 2017-08-24 ENCOUNTER — Telehealth: Payer: Self-pay

## 2017-08-24 NOTE — Telephone Encounter (Signed)
Copied from [place]. Topic: Referral - Medical Records >> Aug 24, 2017  9:39 AM Scherrie Gerlach wrote: Reason for CRM:  April with [facility] calling to ask for additional information on the  pt They need the bone density report, latest available Fax:  [phone]

## 2017-08-24 NOTE — Telephone Encounter (Signed)
Results has been printed and faxed to their office at [phone].

## 2017-09-10 ENCOUNTER — Inpatient Hospital Stay: Payer: Medicare HMO | Attending: Nurse Practitioner | Admitting: Nurse Practitioner

## 2017-09-10 ENCOUNTER — Encounter: Payer: Self-pay | Admitting: Nurse Practitioner

## 2017-09-10 ENCOUNTER — Ambulatory Visit
Admission: RE | Admit: 2017-09-10 | Discharge: 2017-09-10 | Disposition: A | Discharge status: Home / Self Care | Payer: Medicare HMO | Source: Ambulatory Visit | Attending: Nurse Practitioner | Admitting: Nurse Practitioner

## 2017-09-10 DIAGNOSIS — Z122 Encounter for screening for malignant neoplasm of respiratory organs: Secondary | ICD-10-CM

## 2017-09-10 DIAGNOSIS — Z87891 Personal history of nicotine dependence: Secondary | ICD-10-CM

## 2017-09-10 DIAGNOSIS — I700 Atherosclerosis of aorta: Secondary | ICD-10-CM | POA: Diagnosis not present

## 2017-09-10 DIAGNOSIS — J439 Emphysema, unspecified: Secondary | ICD-10-CM | POA: Insufficient documentation

## 2017-09-10 NOTE — Progress Notes (Signed)
In accordance with CMS guidelines, patient has met eligibility criteria including age, absence of signs or symptoms of lung cancer.  Social History   Tobacco Use  . Smoking status: Former Smoker    Packs/day: 2.00    Years: 30.00    Pack years: 60.00    Types: Cigarettes    Start date: 07/07/1956    Last attempt to quit: 12/06/2011    Years since quitting: 5.7  . Smokeless tobacco: Never Used  Substance Use Topics  . Alcohol use: No  . Drug use: No     A shared decision-making session was conducted prior to the performance of CT scan. This includes one or more decision aids, includes benefits and harms of screening, follow-up diagnostic testing, over-diagnosis, false positive rate, and total radiation exposure.  Counseling on the importance of adherence to annual lung cancer LDCT screening, impact of co-morbidities, and ability or willingness to undergo diagnosis and treatment is imperative for compliance of the program.  Counseling on the importance of continued smoking cessation for former smokers; the importance of smoking cessation for current smokers, and information about tobacco cessation interventions have been given to patient including [place] and 1800 quit [place] programs.  Written order for lung cancer screening with LDCT has been given to the patient and any and all questions have been answered to the best of my abilities.   Yearly follow up will be coordinated by Burgess Estelle, Thoracic Navigator.  Faythe Casa, NP 09/10/2017 2:21 PM

## 2017-09-16 ENCOUNTER — Encounter: Payer: Self-pay | Admitting: *Deleted

## 2017-11-05 ENCOUNTER — Other Ambulatory Visit: Payer: Self-pay | Admitting: Family Medicine

## 2017-11-05 NOTE — Telephone Encounter (Signed)
Refill request for general medication: Vitamin D 50,000 units  Last office visit: 08/17/2017  Last physical exam: 08/17/2017  Follow-ups on file. None indicated

## 2018-01-31 ENCOUNTER — Other Ambulatory Visit: Payer: Self-pay | Admitting: Family Medicine

## 2018-04-21 ENCOUNTER — Other Ambulatory Visit: Payer: Self-pay | Admitting: Family Medicine

## 2018-08-17 ENCOUNTER — Ambulatory Visit (INDEPENDENT_AMBULATORY_CARE_PROVIDER_SITE_OTHER): Payer: Medicare HMO

## 2018-08-17 VITALS — BP 122/82 | HR 71 | Temp 98.1°F | Resp 16 | Ht 62.0 in | Wt 194.7 lb

## 2018-08-17 DIAGNOSIS — Z1231 Encounter for screening mammogram for malignant neoplasm of breast: Secondary | ICD-10-CM | POA: Diagnosis not present

## 2018-08-17 DIAGNOSIS — Z0000 Encounter for general adult medical examination without abnormal findings: Secondary | ICD-10-CM

## 2018-08-17 NOTE — Patient Instructions (Signed)
Heather Norris , Thank you for taking time to come for your Medicare Wellness Visit. I appreciate your ongoing commitment to your health goals. Please review the following plan we discussed and let me know if I can assist you in the future.   Screening recommendations/referrals: Colonoscopy: done 07/15/16 Mammogram: done 09/23/16. Please call [phone] to schedule your mammogram.  Bone Density: done 09/23/16 Recommended yearly ophthalmology/optometry visit for glaucoma screening and checkup Recommended yearly dental visit for hygiene and checkup  Vaccinations: Influenza vaccine: postponed Pneumococcal vaccine: done 08/13/16 Tdap vaccine: done 06/11/2011. Repeat in 2022. Shingles vaccine: Shingrix discussed. Please contact your pharmacy for coverage information.   Advanced directives: Advance directive discussed with you today. I have provided a copy for you to complete at home and have notarized. Once this is complete please bring a copy in to our office so we can scan it into your chart.  Conditions/risks identified: Recommend increasing water intake to 6-8 glasses of water per day.  Next appointment: Please follow up in one year for your Medicare Annual Wellness visit.     Preventive Care 14 Years and Older, Female Preventive care refers to lifestyle choices and visits with your health care provider that can promote health and wellness. What does preventive care include?  A yearly physical exam. This is also called an annual well check.  Dental exams once or twice a year.  Routine eye exams. Ask your health care provider how often you should have your eyes checked.  Personal lifestyle choices, including:  Daily care of your teeth and gums.  Regular physical activity.  Eating a healthy diet.  Avoiding tobacco and drug use.  Limiting alcohol use.  Practicing safe sex.  Taking low-dose aspirin every day.  Taking vitamin and mineral supplements as recommended by your health  care provider. What happens during an annual well check? The services and screenings done by your health care provider during your annual well check will depend on your age, overall health, lifestyle risk factors, and family history of disease. Counseling  Your health care provider may ask you questions about your:  Alcohol use.  Tobacco use.  Drug use.  Emotional well-being.  Home and relationship well-being.  Sexual activity.  Eating habits.  History of falls.  Memory and ability to understand (cognition).  Work and work Statistician.  Reproductive health. Screening  You may have the following tests or measurements:  Height, weight, and BMI.  Blood pressure.  Lipid and cholesterol levels. These may be checked every 5 years, or more frequently if you are over 54 years old.  Skin check.  Lung cancer screening. You may have this screening every year starting at age 47 if you have a 30-pack-year history of smoking and currently smoke or have quit within the past 15 years.  Fecal occult blood test (FOBT) of the stool. You may have this test every year starting at age 57.  Flexible sigmoidoscopy or colonoscopy. You may have a sigmoidoscopy every 5 years or a colonoscopy every 10 years starting at age 35.  Hepatitis C blood test.  Hepatitis B blood test.  Sexually transmitted disease (STD) testing.  Diabetes screening. This is done by checking your blood sugar (glucose) after you have not eaten for a while (fasting). You may have this done every 1-3 years.  Bone density scan. This is done to screen for osteoporosis. You may have this done starting at age 71.  Mammogram. This may be done every 1-2 years. Talk to your health  care provider about how often you should have regular mammograms. Talk with your health care provider about your test results, treatment options, and if necessary, the need for more tests. Vaccines  Your health care provider may recommend certain  vaccines, such as:  Influenza vaccine. This is recommended every year.  Tetanus, diphtheria, and acellular pertussis (Tdap, Td) vaccine. You may need a Td booster every 10 years.  Zoster vaccine. You may need this after age 70.  Pneumococcal 13-valent conjugate (PCV13) vaccine. One dose is recommended after age 51.  Pneumococcal polysaccharide (PPSV23) vaccine. One dose is recommended after age 14. Talk to your health care provider about which screenings and vaccines you need and how often you need them. This information is not intended to replace advice given to you by your health care provider. Make sure you discuss any questions you have with your health care provider. Document Released: 07/20/2015 Document Revised: 03/12/2016 Document Reviewed: 04/24/2015 Elsevier Interactive Patient Education  2017 [place] Prevention in the Home Falls can cause injuries. They can happen to people of all ages. There are many things you can do to make your home safe and to help prevent falls. What can I do on the outside of my home?  Regularly fix the edges of walkways and driveways and fix any cracks.  Remove anything that might make you trip as you walk through a door, such as a raised step or threshold.  Trim any bushes or trees on the path to your home.  Use bright outdoor lighting.  Clear any walking paths of anything that might make someone trip, such as rocks or tools.  Regularly check to see if handrails are loose or broken. Make sure that both sides of any steps have handrails.  Any raised decks and porches should have guardrails on the edges.  Have any leaves, snow, or ice cleared regularly.  Use sand or salt on walking paths during winter.  Clean up any spills in your garage right away. This includes oil or grease spills. What can I do in the bathroom?  Use night lights.  Install grab bars by the toilet and in the tub and shower. Do not use towel bars as grab  bars.  Use non-skid mats or decals in the tub or shower.  If you need to sit down in the shower, use a plastic, non-slip stool.  Keep the floor dry. Clean up any water that spills on the floor as soon as it happens.  Remove soap buildup in the tub or shower regularly.  Attach bath mats securely with double-sided non-slip rug tape.  Do not have throw rugs and other things on the floor that can make you trip. What can I do in the bedroom?  Use night lights.  Make sure that you have a light by your bed that is easy to reach.  Do not use any sheets or blankets that are too big for your bed. They should not hang down onto the floor.  Have a firm chair that has side arms. You can use this for support while you get dressed.  Do not have throw rugs and other things on the floor that can make you trip. What can I do in the kitchen?  Clean up any spills right away.  Avoid walking on wet floors.  Keep items that you use a lot in easy-to-reach places.  If you need to reach something above you, use a strong step stool that has a grab  bar.  Keep electrical cords out of the way.  Do not use floor polish or wax that makes floors slippery. If you must use wax, use non-skid floor wax.  Do not have throw rugs and other things on the floor that can make you trip. What can I do with my stairs?  Do not leave any items on the stairs.  Make sure that there are handrails on both sides of the stairs and use them. Fix handrails that are broken or loose. Make sure that handrails are as long as the stairways.  Check any carpeting to make sure that it is firmly attached to the stairs. Fix any carpet that is loose or worn.  Avoid having throw rugs at the top or bottom of the stairs. If you do have throw rugs, attach them to the floor with carpet tape.  Make sure that you have a light switch at the top of the stairs and the bottom of the stairs. If you do not have them, ask someone to add them for  you. What else can I do to help prevent falls?  Wear shoes that:  Do not have high heels.  Have rubber bottoms.  Are comfortable and fit you well.  Are closed at the toe. Do not wear sandals.  If you use a stepladder:  Make sure that it is fully opened. Do not climb a closed stepladder.  Make sure that both sides of the stepladder are locked into place.  Ask someone to hold it for you, if possible.  Clearly mark and make sure that you can see:  Any grab bars or handrails.  First and last steps.  Where the edge of each step is.  Use tools that help you move around (mobility aids) if they are needed. These include:  Canes.  Walkers.  Scooters.  Crutches.  Turn on the lights when you go into a dark area. Replace any light bulbs as soon as they burn out.  Set up your furniture so you have a clear path. Avoid moving your furniture around.  If any of your floors are uneven, fix them.  If there are any pets around you, be aware of where they are.  Review your medicines with your doctor. Some medicines can make you feel dizzy. This can increase your chance of falling. Ask your doctor what other things that you can do to help prevent falls. This information is not intended to replace advice given to you by your health care provider. Make sure you discuss any questions you have with your health care provider. Document Released: 04/19/2009 Document Revised: 11/29/2015 Document Reviewed: 07/28/2014 Elsevier Interactive Patient Education  2017 [employer].

## 2018-08-17 NOTE — Progress Notes (Addendum)
Subjective:   Heather Norris is a 73 y.o. female who presents for Medicare Annual (Subsequent) preventive examination.  Review of Systems:   Cardiac Risk Factors include: advanced age (>53men, >67 women);obesity (BMI >30kg/m2)     Objective:     Vitals: BP 122/82 (BP Location: Right Arm, Patient Position: Sitting, Cuff Size: Large)   Pulse 71   Temp 98.1 F (36.7 C) (Oral)   Resp 16   Ht 5\' 2"  (1.575 m)   Wt 194 lb 11.2 oz (88.3 kg)   SpO2 99%   BMI 35.61 kg/m   Body mass index is 35.61 kg/m.  Advanced Directives 08/17/2018 08/11/2017 08/13/2016  Does Patient Have a Medical Advance Directive? No No No  Would patient like information on creating a medical advance directive? Yes (MAU/Ambulatory/Procedural Areas - Information given) Yes (MAU/Ambulatory/Procedural Areas - Information given) -    Tobacco Social History   Tobacco Use  Smoking Status Former Smoker  . Packs/day: 2.00  . Years: 30.00  . Pack years: 60.00  . Types: Cigarettes  . Start date: 07/07/1956  . Last attempt to quit: 12/06/2011  . Years since quitting: 6.7  Smokeless Tobacco Never Used     Counseling given: Not Answered   Clinical Intake:  Pre-visit preparation completed: Yes  Pain : 0-10 Pain Score: 5  Pain Type: Chronic pain Pain Location: Knee Pain Orientation: Left Pain Descriptors / Indicators: Aching Pain Onset: More than a month ago Pain Frequency: Constant     BMI - recorded: 35.61 Nutritional Status: BMI > 30  Obese Nutritional Risks: None Diabetes: No  How often do you need to have someone help you when you read instructions, pamphlets, or other written materials from your doctor or pharmacy?: 1 - Never What is the last grade level you completed in school?: 7th grade  Interpreter Needed?: No  Information entered by :: Clemetine Marker LPN  Past Medical History:  Diagnosis Date  . GERD (gastroesophageal reflux disease)   . Tinnitus of left ear    Past Surgical History:    Procedure Laterality Date  . CHOLECYSTECTOMY    . COLONOSCOPY  06/25/2011  . GASTRIC BYPASS  1978   Family History  Adopted: Yes  Problem Relation Age of Onset  . Mental illness Daughter        anxiety    Social History   Socioeconomic History  . Marital status: Widowed    Spouse name: Iona Beard  . Number of children: 1  . Years of education: Not on file  . Highest education level: 7th grade  Occupational History  . Occupation: Retired  Scientific laboratory technician  . Financial resource strain: Not hard at all  . Food insecurity:    Worry: Never true    Inability: Never true  . Transportation needs:    Medical: No    Non-medical: No  Tobacco Use  . Smoking status: Former Smoker    Packs/day: 2.00    Years: 30.00    Pack years: 60.00    Types: Cigarettes    Start date: 07/07/1956    Last attempt to quit: 12/06/2011    Years since quitting: 6.7  . Smokeless tobacco: Never Used  Substance and Sexual Activity  . Alcohol use: No  . Drug use: No  . Sexual activity: Not Currently  Lifestyle  . Physical activity:    Days per week: 0 days    Minutes per session: 0 min  . Stress: Not at all  Relationships  .  Social connections:    Talks on phone: Three times a week    Gets together: Twice a week    Attends religious service: More than 4 times per year    Active member of club or organization: Yes    Attends meetings of clubs or organizations: More than 4 times per year    Relationship status: Widowed  Other Topics Concern  . Not on file  Social History Narrative   She has been a widow since 1989   She has not dated since.    She has one child, [name].    Outpatient Encounter Medications as of 08/17/2018  Medication Sig  . vitamin B-12 (CYANOCOBALAMIN) 500 MCG tablet Take 500 mcg by mouth daily.  Marland Kitchen alendronate (FOSAMAX) 70 MG tablet Take 1 tablet (70 mg total) by mouth every 7 (seven) days. Take with a full glass of water on an empty stomach. (Patient not taking: Reported on  08/17/2018)  . calcium-vitamin D (OSCAL WITH D) 500-200 MG-UNIT tablet Take 1 tablet by mouth 2 (two) times daily. (Patient not taking: Reported on 08/17/2018)  . lansoprazole (PREVACID) 15 MG capsule Take 1 capsule (15 mg total) by mouth daily at 12 noon. (Patient not taking: Reported on 08/17/2018)  . Multiple Vitamins-Minerals (MULTIVITAMIN WITH MINERALS) tablet Take 1 tablet by mouth daily. (Patient not taking: Reported on 08/17/2018)  . Vitamin D, Ergocalciferol, (DRISDOL) 50000 units CAPS capsule TAKE 1 CAPSULE (50,000 UNITS TOTAL) BY MOUTH EVERY 7 (SEVEN) DAYS. (Patient not taking: Reported on 08/17/2018)   No facility-administered encounter medications on file as of 08/17/2018.     Activities of Daily Living In your present state of health, do you have any difficulty performing the following activities: 08/17/2018 08/17/2017  Hearing? N N  Comment declines hearing aids -  Vision? N N  Comment wears glasses -  Difficulty concentrating or making decisions? N N  Walking or climbing stairs? Y N  Comment can climb stairs slowly -  Dressing or bathing? N N  Doing errands, shopping? N N  Preparing Food and eating ? N -  Using the Toilet? N -  In the past six months, have you accidently leaked urine? N -  Do you have problems with loss of bowel control? N -  Managing your Medications? N -  Managing your Finances? N -  Housekeeping or managing your Housekeeping? N -  Some recent data might be hidden    Patient Care Team: Steele Sizer, MD as PCP - General (Family Medicine)    Assessment:   This is a routine wellness examination for [place].  Exercise Activities and Dietary recommendations Current Exercise Habits: The patient does not participate in regular exercise at present, Exercise limited by: orthopedic condition(s)  Goals    . DIET - INCREASE WATER INTAKE     Recommend to drink at least 6-8 8oz glasses of water per day.       Fall Risk Fall Risk  08/17/2018 08/17/2017  08/11/2017 08/13/2016  Falls in the past year? 0 No No No  Number falls in past yr: 0 - - -  Injury with Fall? 0 - - -  Follow up Falls prevention discussed - - -   FALL RISK PREVENTION PERTAINING TO THE HOME:  Any stairs in or around the home? No  If so, are they are without handrails? No   Home free of loose throw rugs in walkways, pet beds, electrical cords, etc? Yes  Adequate lighting in your home to reduce  risk of falls? Yes   ASSISTIVE DEVICES UTILIZED TO PREVENT FALLS:  Life alert? No  Use of a cane, walker or w/c? Yes  Grab bars in the bathroom? Yes  Shower chair or bench in shower? No  Elevated toilet seat or a handicapped toilet? No   DME ORDERS:  DME order needed?  No   TIMED UP AND GO:  Was the test performed? Yes .  Length of time to ambulate 10 feet: 6 sec.   GAIT:  Appearance of gait: Gait stead-fast and without the use of an assistive device.   Education: Fall risk prevention has been discussed.  Intervention(s) required? No   Depression Screen PHQ 2/9 Scores 08/17/2018 08/17/2017 08/11/2017 08/13/2016  PHQ - 2 Score 0 0 0 0     Cognitive Function     6CIT Screen 08/17/2018 08/11/2017  What Year? 0 points 0 points  What month? 0 points 0 points  What time? 0 points 0 points  Count back from 20 0 points 0 points  Months in reverse 2 points 2 points  Repeat phrase 0 points 4 points  Total Score 2 6    Immunization History  Administered Date(s) Administered  . Pneumococcal Conjugate-13 08/13/2016  . Pneumococcal-Unspecified 06/11/2011  . Td 06/11/2011    Qualifies for Shingles Vaccine? Yes . Due for Shingrix. Education has been provided regarding the importance of this vaccine. Pt has been advised to call insurance company to determine out of pocket expense. Advised may also receive vaccine at local pharmacy or Health Dept. Verbalized acceptance and understanding.  Tdap: Up to date  Flu Vaccine: Due for Flu vaccine. Does the patient want to receive  this vaccine today?  No . Education has been provided regarding the importance of this vaccine but still declined. Advised may receive this vaccine at local pharmacy or Health Dept. Aware to provide a copy of the vaccination record if obtained from local pharmacy or Health Dept. Verbalized acceptance and understanding.  Pneumococcal Vaccine: Up to date   Screening Tests Health Maintenance  Topic Date Due  . MAMMOGRAM  09/23/2017  . INFLUENZA VACCINE  10/05/2018 (Originally 02/04/2018)  . TETANUS/TDAP  06/10/2021  . COLONOSCOPY  07/15/2026  . DEXA SCAN  Completed  . Hepatitis C Screening  Completed  . PNA vac Low Risk Adult  Completed    Cancer Screenings:  Colorectal Screening: Completed 07/15/16. Repeat every 10 years  Mammogram: Completed 09/23/16. Repeat every year. Ordered today. Pt provided with contact information and advised to call to schedule appt.   Bone Density: Completed 09/23/16. Results reflect OSTEOPENIA. Repeat every 2 years. Pt declined repeat screening.   Lung Cancer Screening: (Low Dose CT Chest recommended if Age 43-80 years, 30 pack-year currently smoking OR have quit w/in 15years.) does qualify. Pt declined repeat screening.    Additional Screening:  Hepatitis C Screening: does qualify; Completed 08/13/16.  Vision Screening: Recommended annual ophthalmology exams for early detection of glaucoma and other disorders of the eye. Is the patient up to date with their annual eye exam?  No  Who is the provider or what is the name of the office in which the pt attends annual eye exams? [place] Screening: Recommended annual dental exams for proper oral hygiene  Community Resource Referral:  CRR required this visit?  No      Plan:    I have personally reviewed and addressed the Medicare Annual Wellness questionnaire and have noted the following in the patient's chart:  A. Medical and social history B. Use of alcohol, tobacco or illicit drugs   C. Current medications and supplements D. Functional ability and status E.  Nutritional status F.  Physical activity G. Advance directives H. List of other physicians I.  Hospitalizations, surgeries, and ER visits in previous 12 months J.  [place] such as hearing and vision if needed, cognitive and depression L. Referrals and appointments   In addition, I have reviewed and discussed with patient certain preventive protocols, quality metrics, and best practice recommendations. A written personalized care plan for preventive services as well as general preventive health recommendations were provided to patient.   Signed,  Clemetine Marker, LPN Nurse Health Advisor   Nurse Notes: Pt doing well. She is due for her annual visit. She is not taking any medications except for OTC vitamin b12. She does have hx of osteopenia and gastric bypass but did not take fosamax as directed due to it making her feel sick. She complains of left knee pain and pain in both feet that feels numb on occasion when she sits for too long without elevating her feet. States she has "broken veins" in her feet that cause them to swell and feel cold at times. Pt also requests a handicap placard, form filled out and given to Dr. Ancil Boozer for review.

## 2018-08-18 ENCOUNTER — Encounter: Payer: Medicare HMO | Admitting: Family Medicine

## 2018-09-04 ENCOUNTER — Telehealth: Payer: Self-pay

## 2018-09-04 NOTE — Telephone Encounter (Signed)
Call pt regarding lung screening. Left message to return call. 

## 2018-09-08 ENCOUNTER — Telehealth: Payer: Self-pay | Admitting: *Deleted

## 2018-09-08 NOTE — Telephone Encounter (Signed)
Attempted to leave message for patient to notify them that it is time to schedule annual low dose lung cancer screening CT scan. However, this option is not available.  

## 2018-09-09 ENCOUNTER — Encounter: Payer: Self-pay | Admitting: *Deleted

## 2018-09-18 ENCOUNTER — Telehealth: Payer: Self-pay

## 2018-09-18 NOTE — Telephone Encounter (Signed)
Call pt regarding lung screening. N/A .Marland Kitchen Unable to leave message mailbox was full.

## 2018-09-23 ENCOUNTER — Encounter: Payer: Self-pay | Admitting: *Deleted

## 2018-10-12 ENCOUNTER — Encounter: Payer: Medicare HMO | Admitting: Family Medicine

## 2018-11-12 ENCOUNTER — Encounter: Payer: Self-pay | Admitting: *Deleted

## 2019-01-27 ENCOUNTER — Encounter: Payer: Self-pay | Admitting: Family Medicine

## 2019-01-27 ENCOUNTER — Other Ambulatory Visit: Payer: Self-pay

## 2019-01-27 ENCOUNTER — Ambulatory Visit (INDEPENDENT_AMBULATORY_CARE_PROVIDER_SITE_OTHER): Payer: Medicare HMO | Admitting: Family Medicine

## 2019-01-27 VITALS — BP 136/86 | HR 82 | Temp 96.9°F | Resp 16 | Ht 60.0 in | Wt 196.6 lb

## 2019-01-27 DIAGNOSIS — E538 Deficiency of other specified B group vitamins: Secondary | ICD-10-CM

## 2019-01-27 DIAGNOSIS — I83893 Varicose veins of bilateral lower extremities with other complications: Secondary | ICD-10-CM

## 2019-01-27 DIAGNOSIS — I700 Atherosclerosis of aorta: Secondary | ICD-10-CM

## 2019-01-27 DIAGNOSIS — E88819 Insulin resistance, unspecified: Secondary | ICD-10-CM

## 2019-01-27 DIAGNOSIS — E2839 Other primary ovarian failure: Secondary | ICD-10-CM

## 2019-01-27 DIAGNOSIS — Z9884 Bariatric surgery status: Secondary | ICD-10-CM

## 2019-01-27 DIAGNOSIS — R202 Paresthesia of skin: Secondary | ICD-10-CM

## 2019-01-27 DIAGNOSIS — Z1231 Encounter for screening mammogram for malignant neoplasm of breast: Secondary | ICD-10-CM

## 2019-01-27 DIAGNOSIS — Z01419 Encounter for gynecological examination (general) (routine) without abnormal findings: Secondary | ICD-10-CM | POA: Diagnosis not present

## 2019-01-27 DIAGNOSIS — R7989 Other specified abnormal findings of blood chemistry: Secondary | ICD-10-CM | POA: Diagnosis not present

## 2019-01-27 DIAGNOSIS — E559 Vitamin D deficiency, unspecified: Secondary | ICD-10-CM

## 2019-01-27 DIAGNOSIS — D692 Other nonthrombocytopenic purpura: Secondary | ICD-10-CM

## 2019-01-27 DIAGNOSIS — E8881 Metabolic syndrome: Secondary | ICD-10-CM | POA: Diagnosis not present

## 2019-01-27 DIAGNOSIS — M170 Bilateral primary osteoarthritis of knee: Secondary | ICD-10-CM

## 2019-01-27 DIAGNOSIS — I10 Essential (primary) hypertension: Secondary | ICD-10-CM

## 2019-01-27 DIAGNOSIS — M8580 Other specified disorders of bone density and structure, unspecified site: Secondary | ICD-10-CM

## 2019-01-27 MED ORDER — VITAMIN D (ERGOCALCIFEROL) 1.25 MG (50000 UNIT) PO CAPS: 50000.0000 [IU] | ORAL_CAPSULE | ORAL | 3 refills | Status: DC

## 2019-01-27 MED ORDER — HYDROCHLOROTHIAZIDE 12.5 MG PO TABS: 12.5000 mg | ORAL_TABLET | Freq: Every day | ORAL | 0 refills | Status: DC

## 2019-01-27 NOTE — Progress Notes (Signed)
Name: Heather Norris   MRN: [id number]    DOB: July 27, 1945   Date:01/27/2019       Progress Note  Subjective  Chief Complaint  Chief Complaint  Patient presents with  . Annual Exam    HPI   Patient presents for annual CPE and follow up  Osteopenia: she stopped taking fosamax states it was upsetting her stomach, refuses to take anything else  HTN: bp has been high for a long time, she is willing to start medication, we will try hctz since she does not like swelling on her legs, she denies chest pain or palpitation. She has SOB when she walks fast  Atherosclerosis of aorta: refuses statins at this time, we will check lipid panel   Knee OA: noticing more unsteady gait , needs to stand up and hold before taking steps, taking Tylenol prn    Diet: not a balanced diet, likes sweets, does not drink water Exercise: not active  USPSTF grade A and B recommendations    Office Visit from 01/27/2019 in [facility]  AUDIT-C Score  0     Depression: Phq 9 is  negative Depression screen [facility] 2/9 01/27/2019 08/17/2018 08/17/2017 08/11/2017 08/13/2016  Decreased Interest 0 0 0 0 0  Down, Depressed, Hopeless 0 0 0 0 0  PHQ - 2 Score 0 0 0 0 0  Altered sleeping 0 - - - -  Tired, decreased energy 0 - - - -  Change in appetite 0 - - - -  Feeling bad or failure about yourself  0 - - - -  Trouble concentrating 0 - - - -  Moving slowly or fidgety/restless 0 - - - -  Suicidal thoughts 0 - - - -  PHQ-9 Score 0 - - - -   Hypertension: BP Readings from Last 3 Encounters:  01/27/19 (!) 160/96  08/17/18 122/82  08/17/17 118/84   Obesity: Wt Readings from Last 3 Encounters:  01/27/19 196 lb 9.6 oz (89.2 kg)  08/17/18 194 lb 11.2 oz (88.3 kg)  09/10/17 176 lb (79.8 kg)   BMI Readings from Last 3 Encounters:  01/27/19 38.40 kg/m  08/17/18 35.61 kg/m  09/10/17 33.25 kg/m    Hep C Screening: normal 08/2016 STD testing and prevention (HIV/chl/gon/syphilis): N/A Intimate  partner violence: negative screen  Sexual History/Pain during Intercourse: not sexually active in years  Menstrual History/LMP/Abnormal Bleeding: discussed post-menopausal bleeding  Incontinence Symptoms: she has urge incontinence , discussed medications, but she wants to hold off for now   Advanced Care Planning: A voluntary discussion about advance care planning including the explanation and discussion of advance directives.  Discussed health care proxy and Living will, and the patient was able to identify a health care proxy as daughter Heather Norris .  Patient does have a living will at present time   Breast cancer: not interested in mammogram BRCA gene screening:Not interested  Cervical cancer screening: up to date   Osteoporosis Screening: she is due for repeat but refuses medication and scan   Glucose:  Glucose, Bld  Date Value Ref Range Status  08/17/2017 106 (H) 65 - 99 mg/dL Final    Comment:    .            Fasting reference interval . For someone without known diabetes, a glucose value between 100 and 125 mg/dL is consistent with prediabetes and should be confirmed with a follow-up test. .   08/13/2016 101 (H) 65 - 99 mg/dL Final  Skin cancer: discussed atypical lesions  Colorectal cancer: 2018 Lung cancer:  Low Dose CT Chest recommended if Age 17-80 years, 30 pack-year currently smoking OR have quit w/in 15years. Patient does qualify.  She had one CT last year, but not interested in having any other ones [id number]   Patient Active Problem List   Diagnosis Date Noted  . Atherosclerosis of aorta ([place]) 01/27/2019  . Senile purpura ([place]) 01/27/2019  . Osteopenia 09/25/2016  . GERD (gastroesophageal reflux disease) 08/13/2016  . Tinnitus of left ear 08/13/2016  . History of bariatric surgery 08/13/2016  . Vitamin D deficiency 08/13/2016  . History of shoulder fracture 08/13/2016  . Osteoarthritis of both knees 08/13/2016  . Insulin resistance 08/13/2016     Past Surgical History:  Procedure Laterality Date  . CHOLECYSTECTOMY    . COLONOSCOPY  06/25/2011  . GASTRIC BYPASS  1978    Family History  Adopted: Yes  Problem Relation Age of Onset  . Mental illness Daughter        anxiety     Social History   Socioeconomic History  . Marital status: Widowed    Spouse name: Iona Beard  . Number of children: 1  . Years of education: Not on file  . Highest education level: 7th grade  Occupational History  . Occupation: Retired  Scientific laboratory technician  . Financial resource strain: Not hard at all  . Food insecurity    Worry: Never true    Inability: Never true  . Transportation needs    Medical: No    Non-medical: No  Tobacco Use  . Smoking status: Former Smoker    Packs/day: 2.00    Years: 30.00    Pack years: 60.00    Types: Cigarettes    Start date: 07/07/1956    Quit date: 12/06/2011    Years since quitting: 7.1  . Smokeless tobacco: Never Used  Substance and Sexual Activity  . Alcohol use: No  . Drug use: No  . Sexual activity: Not Currently  Lifestyle  . Physical activity    Days per week: 0 days    Minutes per session: 0 min  . Stress: Not at all  Relationships  . Social Herbalist on phone: Three times a week    Gets together: Twice a week    Attends religious service: More than 4 times per year    Active member of club or organization: Yes    Attends meetings of clubs or organizations: More than 4 times per year    Relationship status: Widowed  . Intimate partner violence    Fear of current or ex partner: No    Emotionally abused: No    Physically abused: No    Forced sexual activity: No  Other Topics Concern  . Not on file  Social History Narrative   She has been a widow since 1989   She has not dated since.    She has one child, Heather Norris.     Current Outpatient Medications:  .  lansoprazole (PREVACID) 15 MG capsule, Take 1 capsule (15 mg total) by mouth daily at 12 noon., Disp: 30 capsule, Rfl: 0 .   Multiple Vitamins-Minerals (MULTIVITAMIN WITH MINERALS) tablet, Take 1 tablet by mouth daily. (Patient not taking: Reported on 01/27/2019), Disp: 30 tablet, Rfl: 0 .  vitamin B-12 (CYANOCOBALAMIN) 500 MCG tablet, Take 500 mcg by mouth daily., Disp: , Rfl:  .  Vitamin D, Ergocalciferol, (DRISDOL) 1.25 MG (50000 UT) CAPS capsule, Take 1  capsule (50,000 Units total) by mouth every 7 (seven) days., Disp: 12 capsule, Rfl: 3  No Known Allergies   ROS  Constitutional: Negative for fever or weight change.  Respiratory: Negative for cough and shortness of breath.   Cardiovascular: Negative for chest pain or palpitations.  Gastrointestinal: Negative for abdominal pain, no bowel changes.  Musculoskeletal: Negative for gait problem or joint swelling.  Skin: Negative for rash.  Neurological: Negative for dizziness or headache.  No other specific complaints in a complete review of systems (except as listed in HPI above).  Objective  Vitals:   01/27/19 0956  BP: (!) 160/96  Pulse: 82  Resp: 16  Temp: (!) 96.9 F (36.1 C)  TempSrc: Temporal  SpO2: 99%  Weight: 196 lb 9.6 oz (89.2 kg)  Height: 5' (1.524 m)    Body mass index is 38.4 kg/m.  Physical Exam  Constitutional: Patient appears well-developed and obese  No distress.  HENT: Head: Normocephalic and atraumatic. Ears: B TMs ok, no erythema or effusion; Nose: Nose normal. Mouth/Throat: Oropharynx is clear and moist. No oropharyngeal exudate.  Eyes: Conjunctivae and EOM are normal. Pupils are equal, round, and reactive to light. No scleral icterus.  Neck: Normal range of motion. Neck supple. No JVD present. No thyromegaly present.  Cardiovascular: Normal rate, regular rhythm and normal heart sounds.  No murmur heard. No BLE edema. Varicose of both feet  Pulmonary/Chest: Effort normal and breath sounds normal. No respiratory distress. Abdominal: Soft. Bowel sounds are normal, no distension. There is no tenderness. no masses Breast: no  lumps or masses, no nipple discharge or rashes FEMALE GENITALIA:  Not done  RECTAL: no rectal masses or hemorrhoids Musculoskeletal: she has crepitus with extension of both knees, left worse than right  Neurological: he is alert and oriented to person, place, and time. No cranial nerve deficit. Coordination, balance, strength, speech and gait are normal.  Skin: Skin is warm and dry. No rash noted. No erythema.  Psychiatric: Patient has a normal mood and affect. behavior is normal. Judgment and thought content normal.     Fall Risk: Fall Risk  01/27/2019 01/27/2019 08/17/2018 08/17/2017 08/11/2017  Falls in the past year? 0 0 0 No No  Number falls in past yr: 0 0 0 - -  Injury with Fall? 0 0 0 - -  Follow up - - Falls prevention discussed - -    Functional Status Survey: Is the patient deaf or have difficulty hearing?: No Does the patient have difficulty concentrating, remembering, or making decisions?: No Does the patient have difficulty walking or climbing stairs?: Yes Does the patient have difficulty dressing or bathing?: No Does the patient have difficulty doing errands alone such as visiting a doctor's office or shopping?: No  Assessment & Plan  1. Elevated PTHrP level  - COMPLETE METABOLIC PANEL WITH GFR - PTH, intact and calcium  2. Osteopenia, unspecified location  - COMPLETE METABOLIC PANEL WITH GFR - PTH, intact and calcium - DG Bone Density; Future  3. Vitamin D deficiency  - VITAMIN D 25 Hydroxy (Vit-D Deficiency, Fractures) - PTH, intact and calcium  4. History of bariatric surgery  - COMPLETE METABOLIC PANEL WITH GFR  5. Paresthesia of both feet  Needs to resume B12 supplementation   6. Low vitamin B12 level  - B12 - CBC with Differential/Platelet  7. Insulin resistance  - Hemoglobin A1c  8. Well woman exam   9. Encounter for screening mammogram for breast cancer  - MM Digital Screening;  Future  10. Ovarian failure  - DG Bone Density;  Future  11. Atherosclerosis of aorta (HCC)  - Lipid panel  12. Senile purpura (HCC)  On left arm, reassurance for now  13. Primary osteoarthritis of both knees  Does not want to see ortho at this time, discussed tylenol prn  We will give her a handicap form   14. Varicose veins of leg with edema, bilateral  Advised compression stocking hoses   -USPSTF grade A and B recommendations reviewed with patient; age-appropriate recommendations, preventive care, screening tests, etc discussed and encouraged; healthy living encouraged; see AVS for patient education given to patient -Discussed importance of 150 minutes of physical activity weekly, eat two servings of fish weekly, eat one serving of tree nuts ( cashews, pistachios, pecans, almonds.Marland Kitchen) every other day, eat 6 servings of fruit/vegetables daily and drink plenty of water and avoid sweet beverages.

## 2019-01-28 LAB — CBC WITH DIFFERENTIAL/PLATELET
Absolute Monocytes: 378 cells/uL (ref 200–950)
Basophils Absolute: 40 cells/uL (ref 0–200)
Basophils Relative: 0.9 %
Eosinophils Absolute: 48 cells/uL (ref 15–500)
Eosinophils Relative: 1.1 %
HCT: 40 % (ref 35.0–45.0)
Hemoglobin: 13.5 g/dL (ref 11.7–15.5)
Lymphs Abs: 1720 cells/uL (ref 850–3900)
MCH: 29 pg (ref 27.0–33.0)
MCHC: 33.8 g/dL (ref 32.0–36.0)
MCV: 86 fL (ref 80.0–100.0)
MPV: 10.6 fL (ref 7.5–12.5)
Monocytes Relative: 8.6 %
Neutro Abs: 2213 cells/uL (ref 1500–7800)
Neutrophils Relative %: 50.3 %
Platelets: 230 10*3/uL (ref 140–400)
RBC: 4.65 10*6/uL (ref 3.80–5.10)
RDW: 13.4 % (ref 11.0–15.0)
Total Lymphocyte: 39.1 %
WBC: 4.4 10*3/uL (ref 3.8–10.8)

## 2019-01-28 LAB — COMPLETE METABOLIC PANEL WITH GFR
AG Ratio: 1.6 (calc) (ref 1.0–2.5)
ALT: 8 U/L (ref 6–29)
AST: 17 U/L (ref 10–35)
Albumin: 4.2 g/dL (ref 3.6–5.1)
Alkaline phosphatase (APISO): 60 U/L (ref 37–153)
BUN: 16 mg/dL (ref 7–25)
CO2: 23 mmol/L (ref 20–32)
Calcium: 9.6 mg/dL (ref 8.6–10.4)
Chloride: 106 mmol/L (ref 98–110)
Creat: 0.83 mg/dL (ref 0.60–0.93)
GFR, Est African American: 81 mL/min/{1.73_m2} (ref >=60)
GFR, Est Non African American: 69 mL/min/{1.73_m2} (ref >=60)
Globulin: 2.7 g/dL (calc) (ref 1.9–3.7)
Glucose, Bld: 102 mg/dL — ABNORMAL HIGH (ref 65–99)
Potassium: 4.4 mmol/L (ref 3.5–5.3)
Sodium: 142 mmol/L (ref 135–146)
Total Bilirubin: 0.5 mg/dL (ref 0.2–1.2)
Total Protein: 6.9 g/dL (ref 6.1–8.1)

## 2019-01-28 LAB — VITAMIN B12: Vitamin B-12: 364 pg/mL (ref 200–1100)

## 2019-01-28 LAB — HEMOGLOBIN A1C
Hgb A1c MFr Bld: 5.6 % of total Hgb (ref <5.7)
Mean Plasma Glucose: 114 (calc)
eAG (mmol/L): 6.3 (calc)

## 2019-01-28 LAB — LIPID PANEL
Cholesterol: 206 mg/dL — ABNORMAL HIGH (ref <200)
HDL: 65 mg/dL (ref >=50)
LDL Cholesterol (Calc): 123 mg/dL (calc) — ABNORMAL HIGH
Non-HDL Cholesterol (Calc): 141 mg/dL (calc) — ABNORMAL HIGH (ref <130)
Total CHOL/HDL Ratio: 3.2 (calc) (ref <5.0)
Triglycerides: 82 mg/dL (ref <150)

## 2019-01-28 LAB — VITAMIN D 25 HYDROXY (VIT D DEFICIENCY, FRACTURES): Vit D, 25-Hydroxy: 42 ng/mL (ref 30–100)

## 2019-01-28 LAB — PTH, INTACT AND CALCIUM
Calcium: 9.6 mg/dL (ref 8.6–10.4)
PTH: 53 pg/mL (ref 14–64)

## 2019-02-18 ENCOUNTER — Other Ambulatory Visit: Payer: Self-pay | Admitting: Family Medicine

## 2019-02-18 DIAGNOSIS — I10 Essential (primary) hypertension: Secondary | ICD-10-CM

## 2019-02-20 DIAGNOSIS — R202 Paresthesia of skin: Secondary | ICD-10-CM | POA: Diagnosis not present

## 2019-02-20 DIAGNOSIS — H4922 Sixth [abducent] nerve palsy, left eye: Secondary | ICD-10-CM | POA: Diagnosis not present

## 2019-02-20 DIAGNOSIS — I10 Essential (primary) hypertension: Secondary | ICD-10-CM | POA: Diagnosis not present

## 2019-02-20 DIAGNOSIS — H532 Diplopia: Secondary | ICD-10-CM | POA: Diagnosis not present

## 2019-02-20 DIAGNOSIS — R200 Anesthesia of skin: Secondary | ICD-10-CM | POA: Diagnosis not present

## 2019-02-20 DIAGNOSIS — R51 Headache: Secondary | ICD-10-CM | POA: Diagnosis not present

## 2019-02-20 DIAGNOSIS — R292 Abnormal reflex: Secondary | ICD-10-CM | POA: Diagnosis not present

## 2019-02-22 DIAGNOSIS — H43823 Vitreomacular adhesion, bilateral: Secondary | ICD-10-CM | POA: Diagnosis not present

## 2019-03-02 ENCOUNTER — Ambulatory Visit (INDEPENDENT_AMBULATORY_CARE_PROVIDER_SITE_OTHER): Payer: Medicare HMO | Admitting: Family Medicine

## 2019-03-02 ENCOUNTER — Encounter: Payer: Self-pay | Admitting: Family Medicine

## 2019-03-02 ENCOUNTER — Other Ambulatory Visit: Payer: Self-pay

## 2019-03-02 VITALS — BP 142/78 | HR 72 | Temp 96.2°F | Resp 16 | Ht 60.0 in | Wt 199.2 lb

## 2019-03-02 DIAGNOSIS — R030 Elevated blood-pressure reading, without diagnosis of hypertension: Secondary | ICD-10-CM

## 2019-03-02 DIAGNOSIS — I700 Atherosclerosis of aorta: Secondary | ICD-10-CM | POA: Diagnosis not present

## 2019-03-02 DIAGNOSIS — Z23 Encounter for immunization: Secondary | ICD-10-CM

## 2019-03-02 DIAGNOSIS — H4922 Sixth [abducent] nerve palsy, left eye: Secondary | ICD-10-CM

## 2019-03-02 NOTE — Progress Notes (Signed)
Name: Heather Norris   MRN: [id number]    DOB: January 26, 1946   Date:03/02/2019       Progress Note  Subjective  Chief Complaint  Chief Complaint  Patient presents with  . Follow-up    1 month F/U  . Hypertension    Has been low at home but gets elevated here at the Doctor office.   . Eye Problem    Double Vision Went to [facility] and they told the patient she had issues with a blood vessel and put a Pyramid in her eye     HPI  HTN: she came for a CPE in July, bp was elevated on previous visits and we added HCTZ, today she brought her monitor bp with her monitor in our office was 150 with our manual cuff was 142, but the  Memory on her monitor showed controlled bp and at times towards low end of normal in the 90's-100's range. She likely has white coat hypertension, she is not taking hctz , only took one dose the day she was here last. No chest pain or palpitation no dizziness. 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   Atherosclerosis : she refused taking atorvastatin, discussed labs done last visit with patient   Patient Active Problem List   Diagnosis Date Noted  . Atherosclerosis of aorta ([place]) 01/27/2019  . Senile purpura ([place]) 01/27/2019  . Osteopenia 09/25/2016  . GERD (gastroesophageal reflux disease) 08/13/2016  . Tinnitus of left ear 08/13/2016  . History of bariatric surgery 08/13/2016  . Vitamin D deficiency 08/13/2016  . History of shoulder fracture 08/13/2016  . Osteoarthritis of both knees 08/13/2016  . Insulin resistance 08/13/2016    Past Surgical History:  Procedure Laterality Date  .  CHOLECYSTECTOMY    . COLONOSCOPY  06/25/2011  . GASTRIC BYPASS  1978    Family History  Adopted: Yes  Problem Relation Age of Onset  . Mental illness Daughter        anxiety     Social History   Socioeconomic History  . Marital status: Widowed    Spouse name: Iona Beard  . Number of children: 1  . Years of education: Not on file  . Highest education level: 7th grade  Occupational History  . Occupation: Retired  Scientific laboratory technician  . Financial resource strain: Not hard at all  . Food insecurity    Worry: Never true    Inability: Never true  . Transportation needs    Medical: No    Non-medical: No  Tobacco Use  . Smoking status: Former Smoker    Packs/day: 2.00    Years: 30.00    Pack years: 60.00    Types: Cigarettes    Start date: 07/07/1956    Quit date: 12/06/2011    Years since quitting: 7.2  . Smokeless tobacco: Never Used  Substance and Sexual Activity  . Alcohol use: No  . Drug use: No  . Sexual activity: Not Currently  Lifestyle  . Physical activity    Days per week: 0 days    Minutes per session: 0 min  .  Stress: Not at all  Relationships  . Social Herbalist on phone: Three times a week    Gets together: Twice a week    Attends religious service: More than 4 times per year    Active member of club or organization: Yes    Attends meetings of clubs or organizations: More than 4 times per year    Relationship status: Widowed  . Intimate partner violence    Fear of current or ex partner: No    Emotionally abused: No    Physically abused: No    Forced sexual activity: No  Other Topics Concern  . Not on file  Social History Narrative   She has been a widow since 1989   She has not dated since.    She has one child, [name].     Current Outpatient Medications:  .  hydrochlorothiazide (HYDRODIURIL) 12.5 MG tablet, TAKE 1 TABLET BY MOUTH EVERY DAY, Disp: 90 tablet, Rfl: 0 .  lansoprazole (PREVACID) 15 MG capsule, Take 1 capsule (15 mg total) by mouth  daily at 12 noon., Disp: 30 capsule, Rfl: 0 .  Vitamin D, Ergocalciferol, (DRISDOL) 1.25 MG (50000 UT) CAPS capsule, Take 1 capsule (50,000 Units total) by mouth every 7 (seven) days., Disp: 12 capsule, Rfl: 3 .  Multiple Vitamins-Minerals (MULTIVITAMIN WITH MINERALS) tablet, Take 1 tablet by mouth daily. (Patient not taking: Reported on 03/02/2019), Disp: 30 tablet, Rfl: 0 .  vitamin B-12 (CYANOCOBALAMIN) 500 MCG tablet, Take 500 mcg by mouth daily., Disp: , Rfl:   No Known Allergies  I personally reviewed active problem list, medication list, allergies, family history, social history with the patient/caregiver today.   ROS  Ten systems reviewed and is negative except as mentioned in HPI  Objective  Vitals:   03/02/19 1054  BP: (!) 142/78  Pulse: 72  Resp: 16  Temp: (!) 96.2 F (35.7 C)  TempSrc: Temporal  SpO2: 96%  Weight: 199 lb 3.2 oz (90.4 kg)  Height: 5' (1.524 m)    Body mass index is 38.9 kg/m.  Physical Exam  Constitutional: Patient appears well-developed and well-nourished. Obese HEENT: head atraumatic, normocephalic, pupils equal and reactive to light,  Cardiovascular: Normal rate, regular rhythm and normal heart sounds.  No murmur heard. No BLE edema. Pulmonary/Chest: Effort normal and breath sounds normal. No respiratory distress. Abdominal: Soft.  There is no tenderness. Skin: sore on left arm from bumpin on microwave, no signs of infection, and also spot on left eye lid from picking on it  Psychiatric: Patient has a normal mood and affect. behavior is normal. Judgment and thought content normal.  Recent Results (from the past 2160 hour(s))  B12     Status: None   Collection Time: 01/27/19 10:14 AM  Result Value Ref Range   Vitamin B-12 364 200 - 1,100 pg/mL    Comment: . Please Note: Although the reference range for vitamin B12 is [phone] pg/mL, it has been reported that between 5 and 10% of patients with values between 200 and 400 pg/mL may  experience neuropsychiatric and hematologic abnormalities due to occult B12 deficiency; less than 1% of patients with values above 400 pg/mL will have symptoms. .   COMPLETE METABOLIC PANEL WITH GFR     Status: Abnormal   Collection Time: 01/27/19 10:14 AM  Result Value Ref Range   Glucose, Bld 102 (H) 65 - 99 mg/dL    Comment: .  Fasting reference interval . For someone without known diabetes, a glucose value between 100 and 125 mg/dL is consistent with prediabetes and should be confirmed with a follow-up test. .    BUN 16 7 - 25 mg/dL   Creat 0.83 0.60 - 0.93 mg/dL    Comment: For patients >50 years of age, the reference limit for Creatinine is approximately 13% higher for people identified as African-American. .    GFR, Est Non African American 69 > OR = 60 mL/min/1.68m2   GFR, Est African American 81 > OR = 60 mL/min/1.70m2   BUN/Creatinine Ratio NOT APPLICABLE 6 - 22 (calc)   Sodium 142 135 - 146 mmol/L   Potassium 4.4 3.5 - 5.3 mmol/L   Chloride 106 98 - 110 mmol/L   CO2 23 20 - 32 mmol/L   Calcium 9.6 8.6 - 10.4 mg/dL   Total Protein 6.9 6.1 - 8.1 g/dL   Albumin 4.2 3.6 - 5.1 g/dL   Globulin 2.7 1.9 - 3.7 g/dL (calc)   AG Ratio 1.6 1.0 - 2.5 (calc)   Total Bilirubin 0.5 0.2 - 1.2 mg/dL   Alkaline phosphatase (APISO) 60 37 - 153 U/L   AST 17 10 - 35 U/L   ALT 8 6 - 29 U/L  CBC with Differential/Platelet     Status: None   Collection Time: 01/27/19 10:14 AM  Result Value Ref Range   WBC 4.4 3.8 - 10.8 Thousand/uL   RBC 4.65 3.80 - 5.10 Million/uL   Hemoglobin 13.5 11.7 - 15.5 g/dL   HCT 40.0 35.0 - 45.0 %   MCV 86.0 80.0 - 100.0 fL   MCH 29.0 27.0 - 33.0 pg   MCHC 33.8 32.0 - 36.0 g/dL   RDW 13.4 11.0 - 15.0 %   Platelets 230 140 - 400 Thousand/uL   MPV 10.6 7.5 - 12.5 fL   Neutro Abs 2,213 1,500 - 7,800 cells/uL   Lymphs Abs 1,720 850 - 3,900 cells/uL   Absolute Monocytes 378 200 - 950 cells/uL   Eosinophils Absolute 48 15 - 500 cells/uL    Basophils Absolute 40 0 - 200 cells/uL   Neutrophils Relative % 50.3 %   Total Lymphocyte 39.1 %   Monocytes Relative 8.6 %   Eosinophils Relative 1.1 %   Basophils Relative 0.9 %  Hemoglobin A1c     Status: None   Collection Time: 01/27/19 10:14 AM  Result Value Ref Range   Hgb A1c MFr Bld 5.6 <5.7 % of total Hgb    Comment: For the purpose of screening for the presence of diabetes: . <5.7%       Consistent with the absence of diabetes 5.7-6.4%    Consistent with increased risk for diabetes             (prediabetes) > or =6.5%  Consistent with diabetes . This assay result is consistent with a decreased risk of diabetes. . Currently, no consensus exists regarding use of hemoglobin A1c for diagnosis of diabetes in children. . According to American Diabetes Association (ADA) guidelines, hemoglobin A1c <7.0% represents optimal control in non-pregnant diabetic patients. Different metrics may apply to specific patient populations.  Standards of Medical Care in Diabetes(ADA). .    Mean Plasma Glucose 114 (calc)   eAG (mmol/L) 6.3 (calc)  VITAMIN D 25 Hydroxy (Vit-D Deficiency, Fractures)     Status: None   Collection Time: 01/27/19 10:14 AM  Result Value Ref Range   Vit D, 25-Hydroxy 42 30 - 100 ng/mL  Comment: Vitamin D Status         25-OH Vitamin D: . Deficiency:                    <20 ng/mL Insufficiency:             20 - 29 ng/mL Optimal:                 > or = 30 ng/mL . For 25-OH Vitamin D testing on patients on  D2-supplementation and patients for whom quantitation  of D2 and D3 fractions is required, the [facility]ureD(TM) 25-OH VIT D, (D2,D3), LC/MS/MS is recommended: order  code [id number] (patients >17yrs). See Note 1 . Note 1 . For additional information, please refer to  http://education.[facility].com/faq/FAQ199  (This link is being provided for informational/ educational purposes only.)   PTH, intact and calcium     Status: None   Collection Time:  01/27/19 10:14 AM  Result Value Ref Range   PTH 53 14 - 64 pg/mL    Comment: . Interpretive Guide    Intact PTH           Calcium ------------------    ----------           ------- Normal Parathyroid    Normal               Normal Hypoparathyroidism    Low or Low Normal    Low Hyperparathyroidism    Primary            Normal or High       High    Secondary          High                 Normal or Low    Tertiary           High                 High Non-Parathyroid    Hypercalcemia      Low or Low Normal    High .    Calcium 9.6 8.6 - 10.4 mg/dL  Lipid panel     Status: Abnormal   Collection Time: 01/27/19 10:14 AM  Result Value Ref Range   Cholesterol 206 (H) <200 mg/dL   HDL 65 > OR = 50 mg/dL   Triglycerides 82 <150 mg/dL   LDL Cholesterol (Calc) 123 (H) mg/dL (calc)    Comment: Reference range: <100 . Desirable range <100 mg/dL for primary prevention;   <70 mg/dL for patients with CHD or diabetic patients  with > or = 2 CHD risk factors. Marland Kitchen LDL-C is now calculated using the Martin-Hopkins  calculation, which is a validated novel method providing  better accuracy than the Friedewald equation in the  estimation of LDL-C.  Cresenciano Genre et al. Annamaria Helling. [id number]): 2061-2068  (http://education.[facility].com/faq/FAQ164)    Total CHOL/HDL Ratio 3.2 <5.0 (calc)   Non-HDL Cholesterol (Calc) 141 (H) <130 mg/dL (calc)    Comment: For patients with diabetes plus 1 major ASCVD risk  factor, treating to a non-HDL-C goal of <100 mg/dL  (LDL-C of <70 mg/dL) is considered a therapeutic  option.       PHQ2/9: Depression screen [facility] 2/9 03/02/2019 01/27/2019 08/17/2018 08/17/2017 08/11/2017  Decreased Interest 0 0 0 0 0  Down, Depressed, Hopeless 0 0 0 0 0  PHQ - 2 Score 0 0 0 0 0  Altered sleeping 0 0 - - -  Tired, decreased  energy 0 0 - - -  Change in appetite 0 0 - - -  Feeling bad or failure about yourself  0 0 - - -  Trouble concentrating 0 0 - - -  Moving slowly or  fidgety/restless 0 0 - - -  Suicidal thoughts 0 0 - - -  PHQ-9 Score 0 0 - - -    phq 9 is negative   Fall Risk: Fall Risk  03/02/2019 01/27/2019 01/27/2019 08/17/2018 08/17/2017  Falls in the past year? 0 0 0 0 No  Number falls in past yr: 0 0 0 0 -  Injury with Fall? 0 0 0 0 -  Follow up - - - Falls prevention discussed -    Functional Status Survey: Is the patient deaf or have difficulty hearing?: No Does the patient have difficulty seeing, even when wearing glasses/contacts?: Yes Does the patient have difficulty concentrating, remembering, or making decisions?: No Does the patient have difficulty walking or climbing stairs?: No Does the patient have difficulty dressing or bathing?: No Does the patient have difficulty doing errands alone such as visiting a doctor's office or shopping?: No    Assessment & Plan  1. Sixth nerve palsy of left eye  Keep appointment for MRI and neurologist, doing well today, reviewed studies done at [facility]   2. White coat syndrome without hypertension  She is not on medication, keep a log of bp at home  There are no diagnoses linked to this encounter.  3. Atherosclerosis of aorta (HCC)  Refuses statin therapy   4. Needs flu shot  refused

## 2019-03-08 DIAGNOSIS — H532 Diplopia: Secondary | ICD-10-CM | POA: Diagnosis not present

## 2019-03-08 DIAGNOSIS — G959 Disease of spinal cord, unspecified: Secondary | ICD-10-CM | POA: Diagnosis not present

## 2019-03-08 DIAGNOSIS — M4802 Spinal stenosis, cervical region: Secondary | ICD-10-CM | POA: Diagnosis not present

## 2019-03-08 DIAGNOSIS — R292 Abnormal reflex: Secondary | ICD-10-CM | POA: Diagnosis not present

## 2019-03-08 DIAGNOSIS — M50222 Other cervical disc displacement at C5-C6 level: Secondary | ICD-10-CM | POA: Diagnosis not present

## 2019-03-09 ENCOUNTER — Telehealth: Payer: Self-pay | Admitting: Family Medicine

## 2019-03-09 NOTE — Chronic Care Management (AMB) (Signed)
°  Chronic Care Management   Outreach Note  03/09/2019 Name: Heather Norris MRN: [id number] DOB: 30-Mar-1946  Referred by: Steele Sizer, MD Reason for referral : Chronic Care Management (Initial CCM outreach was unsuccessful)   An unsuccessful telephone outreach was attempted today. The patient was referred to the case management team by for assistance with chronic care management and care coordination.   Follow Up Plan: The care management team will reach out to the patient again over the next 7 days.  [name]  [name]   [name]

## 2019-05-27 DIAGNOSIS — H4922 Sixth [abducent] nerve palsy, left eye: Secondary | ICD-10-CM | POA: Diagnosis not present

## 2019-08-22 IMAGING — CT CT CHEST LUNG CANCER SCREENING LOW DOSE W/O CM
2 of 5 series · 15 of 40 positions shown, 18 images · non-contrast
Comparison: None.

CLINICAL DATA: Ex-smoker, quitting 6 years ago. Sixty pack-year
history.

EXAM:
CT CHEST WITHOUT CONTRAST LOW-DOSE FOR LUNG CANCER SCREENING
TECHNIQUE: Multidetector CT imaging of the chest was performed following the
standard protocol without IV contrast.

[Series 3: lung · axial · 0.64mm/px · z∈[-1220,-936]mm · 12 of 314 slices shown, 15 images (1 of 2)]
[im 15/314  mediastinal]
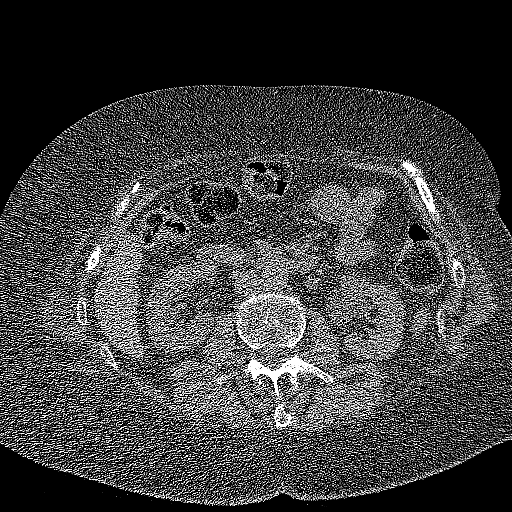
[im 15/314  lung]
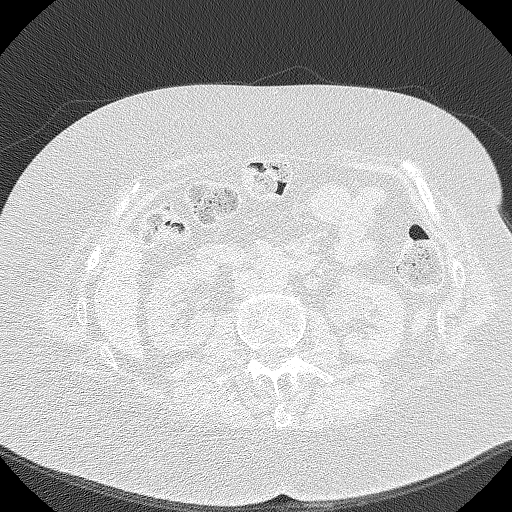
[im 43/314  lung]
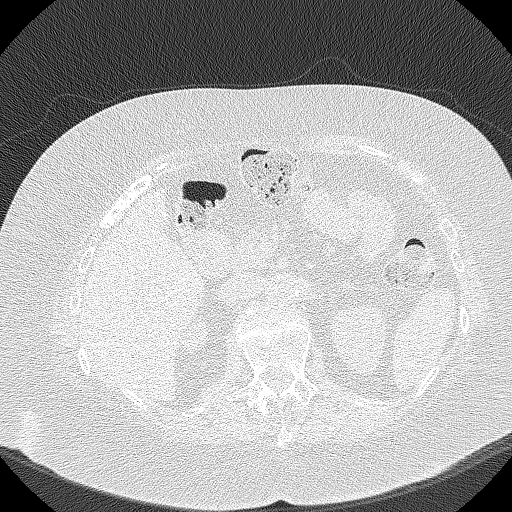
[im 72/314  lung]
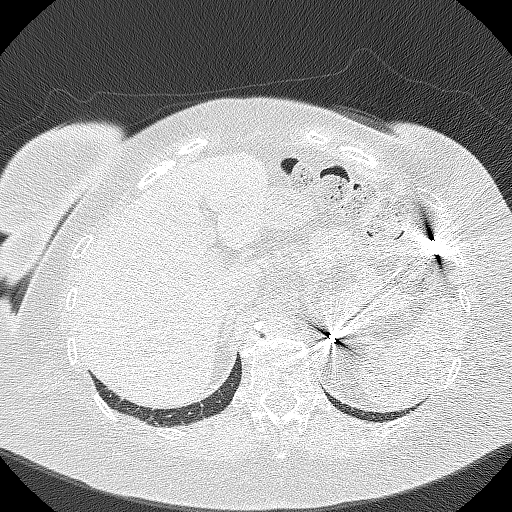
[im 100/314  lung]
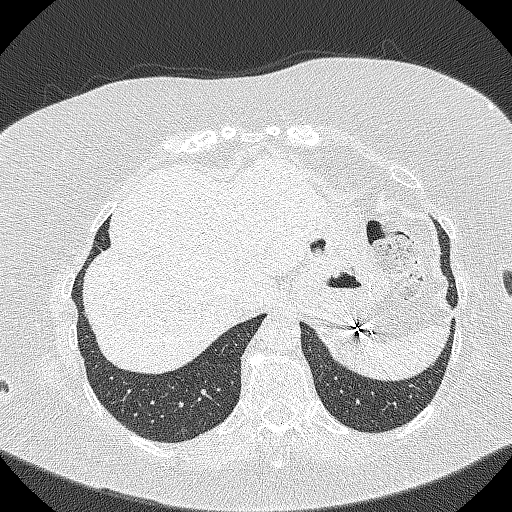
[im 114/314  mediastinal]
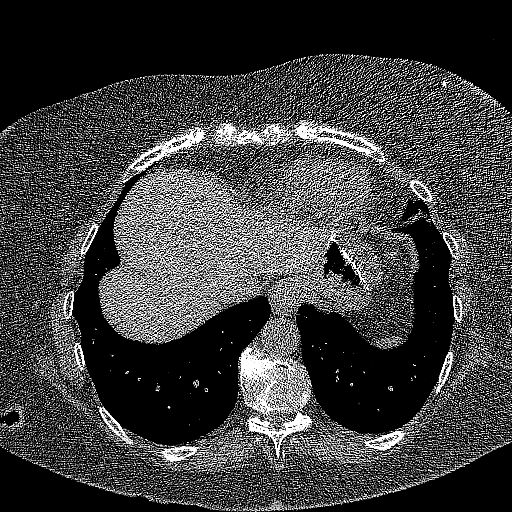
[im 114/314  lung]
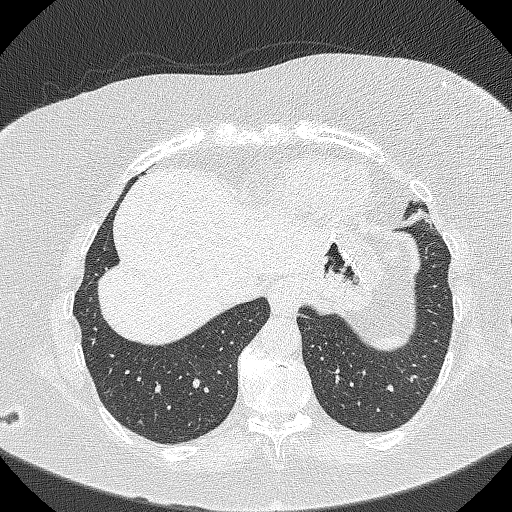
[im 143/314  lung]
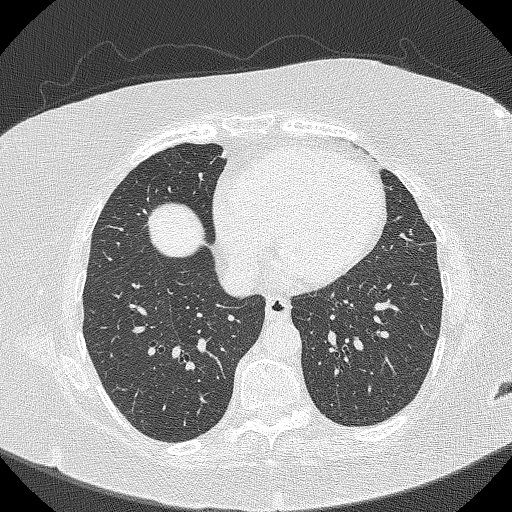
[im 171/314  lung]
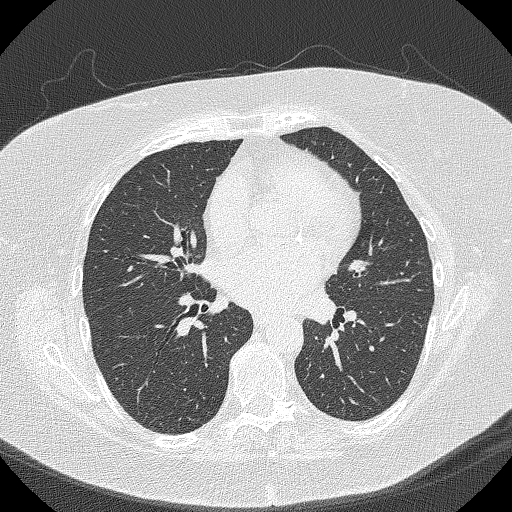
[im 200/314  lung]
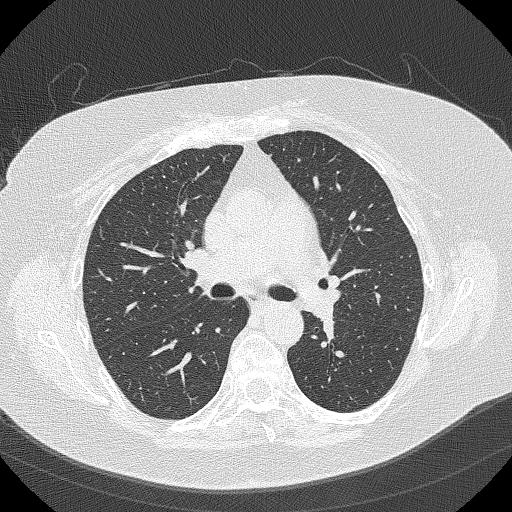
[im 214/314  mediastinal]
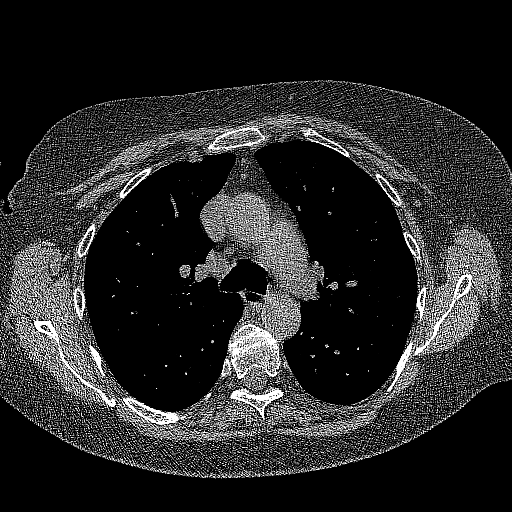
[im 214/314  lung]
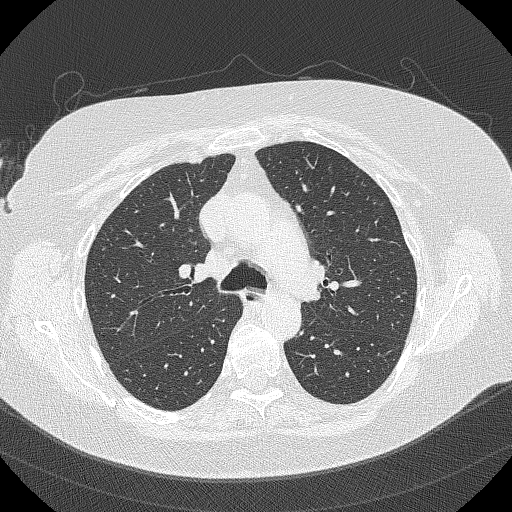
[im 242/314  lung]
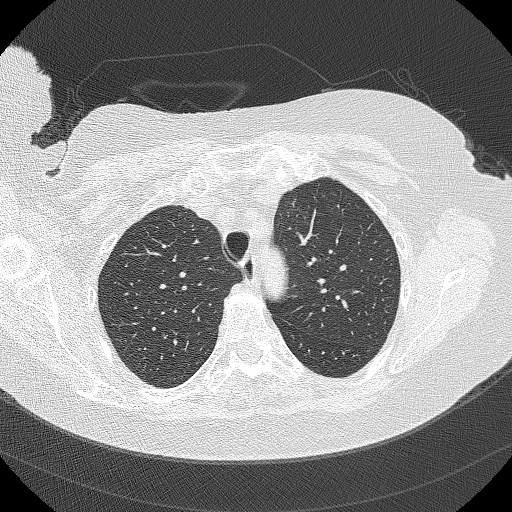
[im 271/314  lung]
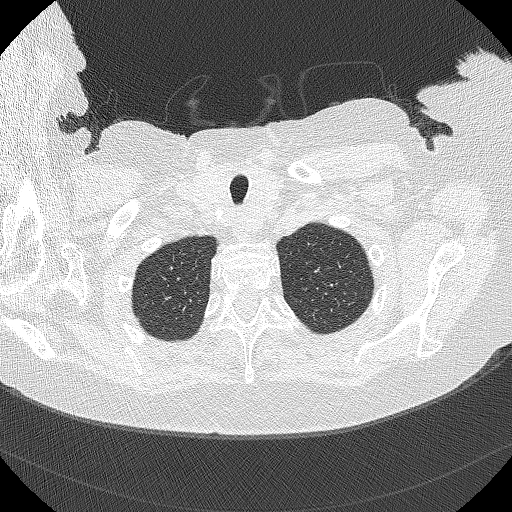
[im 299/314  lung]
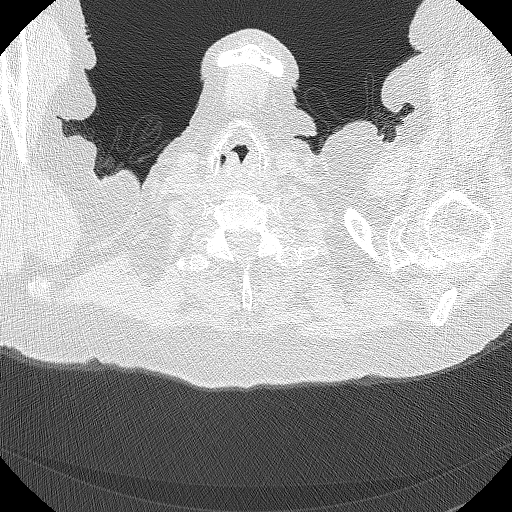

[Series 4: lung · coronal · 0.61mm/px · 3 of 277 slices shown (2 of 2)]
[im 56/277  lung]
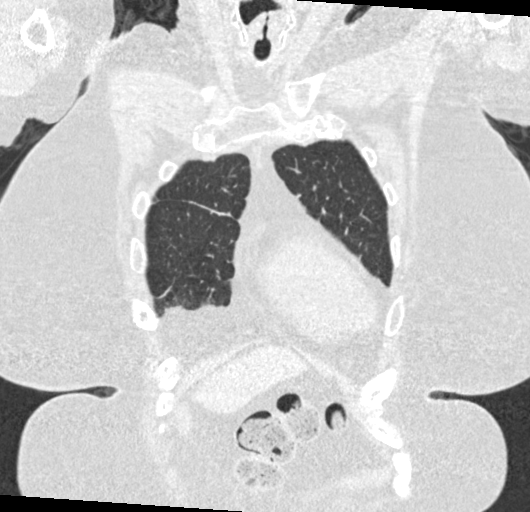
[im 111/277  lung]
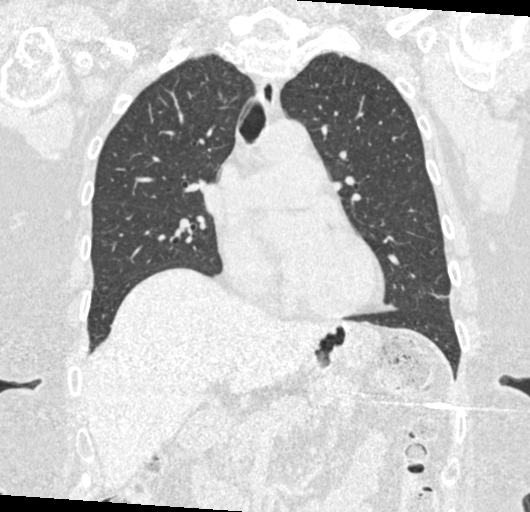
[im 166/277  lung]
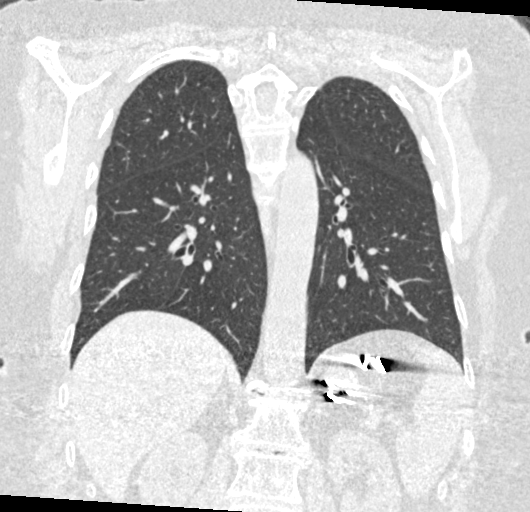

[15 of 40 positions shown; findings below may reference images not displayed]

FINDINGS: Cardiovascular: Aortic atherosclerosis. Borderline cardiomegaly,
without pericardial effusion.

Mediastinum/Nodes: Mild diffuse thyroid enlargement, without
well-defined nodule or mass. No mediastinal or definite hilar
adenopathy, given limitations of unenhanced CT.

Lungs/Pleura: No pleural fluid. Mild centrilobular emphysema. No
suspicious pulmonary nodule or mass.

Upper Abdomen: Normal imaged portions of the liver, spleen,
pancreas, adrenal glands, right kidney. Suspect a left renal sinus
cyst. Surgical changes about the stomach, likely gastrojejunostomy.

Musculoskeletal: Moderate thoracic spondylosis with mild
accentuation of expected kyphosis.
IMPRESSION: 1. Lung-RADS 1, negative. Continue annual screening with low-dose
chest CT without contrast in 12 months.
2. Aortic atherosclerosis (2RNI8-84I.I) and emphysema (2RNI8-9GN.G).

## 2019-08-23 ENCOUNTER — Ambulatory Visit: Payer: Medicare HMO

## 2019-09-01 ENCOUNTER — Ambulatory Visit (INDEPENDENT_AMBULATORY_CARE_PROVIDER_SITE_OTHER): Payer: Medicare HMO

## 2019-09-01 ENCOUNTER — Other Ambulatory Visit: Payer: Self-pay

## 2019-09-01 VITALS — BP 138/86 | HR 60 | Temp 97.5°F | Resp 16 | Ht 60.0 in | Wt 196.8 lb

## 2019-09-01 DIAGNOSIS — Z0000 Encounter for general adult medical examination without abnormal findings: Secondary | ICD-10-CM

## 2019-09-01 NOTE — Progress Notes (Signed)
Subjective:   Heather Norris is a 74 y.o. female who presents for Medicare Annual (Subsequent) preventive examination.  Review of Systems:   Cardiac Risk Factors include: advanced age (>9men, >33 women);obesity (BMI >30kg/m2)     Objective:     Vitals: BP 138/86 (BP Location: Left Arm, Patient Position: Sitting, Cuff Size: Normal)   Pulse 60   Temp (!) 97.5 F (36.4 C) (Temporal)   Resp 16   Ht 5' (1.524 m)   Wt 196 lb 12.8 oz (89.3 kg)   SpO2 96%   BMI 38.43 kg/m   Body mass index is 38.43 kg/m.  Advanced Directives 08/17/2018 08/11/2017 08/13/2016  Does Patient Have a Medical Advance Directive? No No No  Would patient like information on creating a medical advance directive? Yes (MAU/Ambulatory/Procedural Areas - Information given) Yes (MAU/Ambulatory/Procedural Areas - Information given) -    Tobacco Social History   Tobacco Use  Smoking Status Former Smoker  . Packs/day: 2.00  . Years: 30.00  . Pack years: 60.00  . Types: Cigarettes  . Start date: 07/07/1956  . Quit date: 12/06/2011  . Years since quitting: 7.7  Smokeless Tobacco Never Used     Counseling given: Not Answered   Clinical Intake:  Pre-visit preparation completed: Yes  Pain : No/denies pain     BMI - recorded: 38.43 Nutritional Status: BMI > 30  Obese Nutritional Risks: None Diabetes: No  How often do you need to have someone help you when you read instructions, pamphlets, or other written materials from your doctor or pharmacy?: 1 - Never  Interpreter Needed?: No  Information entered by :: Heather Marker LPN  Past Medical History:  Diagnosis Date  . GERD (gastroesophageal reflux disease)   . Tinnitus of left ear    Past Surgical History:  Procedure Laterality Date  . CHOLECYSTECTOMY    . COLONOSCOPY  06/25/2011  . GASTRIC BYPASS  1978   Family History  Adopted: Yes  Problem Relation Age of Onset  . Mental illness Daughter        anxiety    Social History   Socioeconomic  History  . Marital status: Widowed    Spouse name: Heather Norris  . Number of children: 1  . Years of education: Not on file  . Highest education level: 7th grade  Occupational History  . Occupation: Retired  Tobacco Use  . Smoking status: Former Smoker    Packs/day: 2.00    Years: 30.00    Pack years: 60.00    Types: Cigarettes    Start date: 07/07/1956    Quit date: 12/06/2011    Years since quitting: 7.7  . Smokeless tobacco: Never Used  Substance and Sexual Activity  . Alcohol use: No  . Drug use: No  . Sexual activity: Not Currently  Other Topics Concern  . Not on file  Social History Narrative   She has been a widow since 1989   She has not dated since.    She has one child, Heather Norris.   Social Determinants of Health   Financial Resource Strain: Low Risk   . Difficulty of Paying Living Expenses: Not hard at all  Food Insecurity: No Food Insecurity  . Worried About Charity fundraiser in the Last Year: Never true  . Ran Out of Food in the Last Year: Never true  Transportation Needs: No Transportation Needs  . Lack of Transportation (Medical): No  . Lack of Transportation (Non-Medical): No  Physical Activity: Inactive  .  Days of Exercise per Week: 0 days  . Minutes of Exercise per Session: 0 min  Stress: No Stress Concern Present  . Feeling of Stress : Not at all  Social Connections: Slightly Isolated  . Frequency of Communication with Friends and Family: More than three times a week  . Frequency of Social Gatherings with Friends and Family: Twice a week  . Attends Religious Services: More than 4 times per year  . Active Member of Clubs or Organizations: Yes  . Attends Archivist Meetings: More than 4 times per year  . Marital Status: Widowed    Outpatient Encounter Medications as of 09/01/2019  Medication Sig  . esomeprazole (NEXIUM 24HR) 20 MG capsule Take 20 mg by mouth at bedtime. OTC  . Multiple Vitamins-Minerals (MULTIVITAMIN WITH MINERALS) tablet Take 1  tablet by mouth daily. (Patient not taking: Reported on 03/02/2019)  . vitamin B-12 (CYANOCOBALAMIN) 500 MCG tablet Take 500 mcg by mouth daily.  . [DISCONTINUED] lansoprazole (PREVACID) 15 MG capsule Take 1 capsule (15 mg total) by mouth daily at 12 noon.  . [DISCONTINUED] Vitamin D, Ergocalciferol, (DRISDOL) 1.25 MG (50000 UT) CAPS capsule Take 1 capsule (50,000 Units total) by mouth every 7 (seven) days.   No facility-administered encounter medications on file as of 09/01/2019.    Activities of Daily Living In your present state of health, do you have any difficulty performing the following activities: 09/01/2019 03/02/2019  Hearing? N N  Comment declines hearing aids -  Vision? N Y  Difficulty concentrating or making decisions? N N  Walking or climbing stairs? N N  Dressing or bathing? N N  Doing errands, shopping? N N  Preparing Food and eating ? N -  Using the Toilet? N -  In the past six months, have you accidently leaked urine? N -  Do you have problems with loss of bowel control? N -  Managing your Medications? N -  Managing your Finances? N -  Housekeeping or managing your Housekeeping? N -  Some recent data might be hidden    Patient Care Team: Steele Sizer, MD as PCP - General (Family Medicine)    Assessment:   This is a routine wellness examination for [name].  Exercise Activities and Dietary recommendations Current Exercise Habits: The patient does not participate in regular exercise at present, Exercise limited by: None identified  Goals    . DIET - INCREASE WATER INTAKE     Recommend to drink at least 6-8 8oz glasses of water per day.       Fall Risk Fall Risk  09/01/2019 03/02/2019 01/27/2019 01/27/2019 08/17/2018  Falls in the past year? 1 0 0 0 0  Number falls in past yr: 1 0 0 0 0  Comment slipped on icy porch, no injury - - - -  Injury with Fall? 0 0 0 0 0  Risk for fall due to : History of fall(s) - - - -  Follow up Falls prevention discussed - - -  Falls prevention discussed   FALL RISK PREVENTION PERTAINING TO THE HOME:  Any stairs in or around the home? No  If so, do they handrails? No  - ramp  Home free of loose throw rugs in walkways, pet beds, electrical cords, etc? Yes  Adequate lighting in your home to reduce risk of falls? Yes   ASSISTIVE DEVICES UTILIZED TO PREVENT FALLS:  Life alert? No  Use of a cane, walker or w/c? No  Grab bars in the bathroom?  Yes  Shower chair or bench in shower? No  Elevated toilet seat or a handicapped toilet? Yes   DME ORDERS:  DME order needed?  No   TIMED UP AND GO:  Was the test performed? Yes .  Length of time to ambulate 10 feet: 6 sec.   GAIT:  Appearance of gait: Gait stead-fast and without the use of an assistive device.   Education: Fall risk prevention has been discussed.  Intervention(s) required? No   Depression Screen PHQ 2/9 Scores 09/01/2019 03/02/2019 01/27/2019 08/17/2018  PHQ - 2 Score 0 0 0 0  PHQ- 9 Score - 0 0 -     Cognitive Function     6CIT Screen 09/01/2019 08/17/2018 08/11/2017  What Year? 0 points 0 points 0 points  What month? 0 points 0 points 0 points  What time? 0 points 0 points 0 points  Count back from 20 0 points 0 points 0 points  Months in reverse 0 points 2 points 2 points  Repeat phrase 2 points 0 points 4 points  Total Score 2 2 6     Immunization History  Administered Date(s) Administered  . Pneumococcal Conjugate-13 08/13/2016  . Pneumococcal-Unspecified 06/11/2011  . Td 06/11/2011    Qualifies for Shingles Vaccine? Yes  . Due for Shingrix. Education has been provided regarding the importance of this vaccine. Pt has been advised to call insurance company to determine out of pocket expense. Advised may also receive vaccine at local pharmacy or Health Dept. Verbalized acceptance and understanding.  Tdap: Up to date  Flu Vaccine: Due for Flu vaccine. Does the patient want to receive this vaccine today?  No . Education has been  provided regarding the importance of this vaccine but still declined. Advised may receive this vaccine at local pharmacy or Health Dept. Aware to provide a copy of the vaccination record if obtained from local pharmacy or Health Dept. Verbalized acceptance and understanding.  Pneumococcal Vaccine: Up to date   Screening Tests Health Maintenance  Topic Date Due  . MAMMOGRAM  09/23/2017  . INFLUENZA VACCINE  10/05/2019 (Originally 02/05/2019)  . TETANUS/TDAP  06/10/2021  . COLONOSCOPY  07/15/2026  . DEXA SCAN  Completed  . Hepatitis C Screening  Completed  . PNA vac Low Risk Adult  Completed    Cancer Screenings:  Colorectal Screening: Completed done 07/25/16.  No longer required.   Mammogram: Completed 09/23/16. Repeat every year; pt declines repeat screening at this time.   Bone Density: Completed 09/23/16. Results reflect  OSTEOPENIA. Repeat every 2 years. Pt declines repeat screening at this time.   Lung Cancer Screening: (Low Dose CT Chest recommended if Age 26-80 years, 30 pack-year currently smoking OR have quit w/in 15years.) does qualify. Completed 09/10/17. Pt declines repeat screening at this time.   Additional Screening:  Hepatitis C Screening: does qualify; Completed 08/13/16  Vision Screening: Recommended annual ophthalmology exams for early detection of glaucoma and other disorders of the eye. Is the patient up to date with their annual eye exam?  Yes  Who is the provider or what is the name of the office in which the pt attends annual eye exams? [facility] Screening: Recommended annual dental exams for proper oral hygiene  Community Resource Referral:  CRR required this visit?  No      Plan:     I have personally reviewed and addressed the Medicare Annual Wellness questionnaire and have noted the following in the patient's chart:  A. Medical and  social history B. Use of alcohol, tobacco or illicit drugs  C. Current medications and supplements D.  Functional ability and status E.  Nutritional status F.  Physical activity G. Advance directives H. List of other physicians I.  Hospitalizations, surgeries, and ER visits in previous 12 months J.  Welcome such as hearing and vision if needed, cognitive and depression L. Referrals and appointments   In addition, I have reviewed and discussed with patient certain preventive protocols, quality metrics, and best practice recommendations. A written personalized care plan for preventive services as well as general preventive health recommendations were provided to patient.    Signed,  Heather Marker, LPN Nurse Health Advisor   Nurse Notes: Pt accompanied to visit today by her daughter Heather Norris. She requests to renew her handicap placard. Application printed and will be given to Dr. Ancil Boozer to sign off. Also advised pt due for f/u with Dr. Ancil Boozer and she scheduled appt for 10/17/19.

## 2019-09-01 NOTE — Patient Instructions (Signed)
Heather Norris , Thank you for taking time to come for your Medicare Wellness Visit. I appreciate your ongoing commitment to your health goals. Please review the following plan we discussed and let me know if I can assist you in the future.   Screening recommendations/referrals: Colonoscopy: no longer required  Mammogram: done 09/23/16 Bone Density: done 09/23/16 Recommended yearly ophthalmology/optometry visit for glaucoma screening and checkup Recommended yearly dental visit for hygiene and checkup  Vaccinations: Influenza vaccine: postponed Pneumococcal vaccine: done 08/13/16 Tdap vaccine: done 06/11/11 Shingles vaccine: Shingrix discussed. Please contact your pharmacy for coverage information.   Conditions/risks identified: recommend drinking 6-8 glasses of water per day  Next appointment: Please follow up in one year for your Medicare Annual Wellness visit.     Preventive Care 42 Years and Older, Female Preventive care refers to lifestyle choices and visits with your health care provider that can promote health and wellness. What does preventive care include?  A yearly physical exam. This is also called an annual well check.  Dental exams once or twice a year.  Routine eye exams. Ask your health care provider how often you should have your eyes checked.  Personal lifestyle choices, including:  Daily care of your teeth and gums.  Regular physical activity.  Eating a healthy diet.  Avoiding tobacco and drug use.  Limiting alcohol use.  Practicing safe sex.  Taking low-dose aspirin every day.  Taking vitamin and mineral supplements as recommended by your health care provider. What happens during an annual well check? The services and screenings done by your health care provider during your annual well check will depend on your age, overall health, lifestyle risk factors, and family history of disease. Counseling  Your health care provider may ask you questions about  your:  Alcohol use.  Tobacco use.  Drug use.  Emotional well-being.  Home and relationship well-being.  Sexual activity.  Eating habits.  History of falls.  Memory and ability to understand (cognition).  Work and work Statistician.  Reproductive health. Screening  You may have the following tests or measurements:  Height, weight, and BMI.  Blood pressure.  Lipid and cholesterol levels. These may be checked every 5 years, or more frequently if you are over 35 years old.  Skin check.  Lung cancer screening. You may have this screening every year starting at age 26 if you have a 30-pack-year history of smoking and currently smoke or have quit within the past 15 years.  Fecal occult blood test (FOBT) of the stool. You may have this test every year starting at age 62.  Flexible sigmoidoscopy or colonoscopy. You may have a sigmoidoscopy every 5 years or a colonoscopy every 10 years starting at age 45.  Hepatitis C blood test.  Hepatitis B blood test.  Sexually transmitted disease (STD) testing.  Diabetes screening. This is done by checking your blood sugar (glucose) after you have not eaten for a while (fasting). You may have this done every 1-3 years.  Bone density scan. This is done to screen for osteoporosis. You may have this done starting at age 36.  Mammogram. This may be done every 1-2 years. Talk to your health care provider about how often you should have regular mammograms. Talk with your health care provider about your test results, treatment options, and if necessary, the need for more tests. Vaccines  Your health care provider may recommend certain vaccines, such as:  Influenza vaccine. This is recommended every year.  Tetanus, diphtheria, and acellular  pertussis (Tdap, Td) vaccine. You may need a Td booster every 10 years.  Zoster vaccine. You may need this after age 60.  Pneumococcal 13-valent conjugate (PCV13) vaccine. One dose is recommended  after age 73.  Pneumococcal polysaccharide (PPSV23) vaccine. One dose is recommended after age 65. Talk to your health care provider about which screenings and vaccines you need and how often you need them. This information is not intended to replace advice given to you by your health care provider. Make sure you discuss any questions you have with your health care provider. Document Released: 07/20/2015 Document Revised: 03/12/2016 Document Reviewed: 04/24/2015 Elsevier Interactive Patient Education  2017 [place] Prevention in the Home Falls can cause injuries. They can happen to people of all ages. There are many things you can do to make your home safe and to help prevent falls. What can I do on the outside of my home?  Regularly fix the edges of walkways and driveways and fix any cracks.  Remove anything that might make you trip as you walk through a door, such as a raised step or threshold.  Trim any bushes or trees on the path to your home.  Use bright outdoor lighting.  Clear any walking paths of anything that might make someone trip, such as rocks or tools.  Regularly check to see if handrails are loose or broken. Make sure that both sides of any steps have handrails.  Any raised decks and porches should have guardrails on the edges.  Have any leaves, snow, or ice cleared regularly.  Use sand or salt on walking paths during winter.  Clean up any spills in your garage right away. This includes oil or grease spills. What can I do in the bathroom?  Use night lights.  Install grab bars by the toilet and in the tub and shower. Do not use towel bars as grab bars.  Use non-skid mats or decals in the tub or shower.  If you need to sit down in the shower, use a plastic, non-slip stool.  Keep the floor dry. Clean up any water that spills on the floor as soon as it happens.  Remove soap buildup in the tub or shower regularly.  Attach bath mats securely with  double-sided non-slip rug tape.  Do not have throw rugs and other things on the floor that can make you trip. What can I do in the bedroom?  Use night lights.  Make sure that you have a light by your bed that is easy to reach.  Do not use any sheets or blankets that are too big for your bed. They should not hang down onto the floor.  Have a firm chair that has side arms. You can use this for support while you get dressed.  Do not have throw rugs and other things on the floor that can make you trip. What can I do in the kitchen?  Clean up any spills right away.  Avoid walking on wet floors.  Keep items that you use a lot in easy-to-reach places.  If you need to reach something above you, use a strong step stool that has a grab bar.  Keep electrical cords out of the way.  Do not use floor polish or wax that makes floors slippery. If you must use wax, use non-skid floor wax.  Do not have throw rugs and other things on the floor that can make you trip. What can I do with my stairs?  Do not leave any items on the stairs.  Make sure that there are handrails on both sides of the stairs and use them. Fix handrails that are broken or loose. Make sure that handrails are as long as the stairways.  Check any carpeting to make sure that it is firmly attached to the stairs. Fix any carpet that is loose or worn.  Avoid having throw rugs at the top or bottom of the stairs. If you do have throw rugs, attach them to the floor with carpet tape.  Make sure that you have a light switch at the top of the stairs and the bottom of the stairs. If you do not have them, ask someone to add them for you. What else can I do to help prevent falls?  Wear shoes that:  Do not have high heels.  Have rubber bottoms.  Are comfortable and fit you well.  Are closed at the toe. Do not wear sandals.  If you use a stepladder:  Make sure that it is fully opened. Do not climb a closed stepladder.  Make  sure that both sides of the stepladder are locked into place.  Ask someone to hold it for you, if possible.  Clearly mark and make sure that you can see:  Any grab bars or handrails.  First and last steps.  Where the edge of each step is.  Use tools that help you move around (mobility aids) if they are needed. These include:  Canes.  Walkers.  Scooters.  Crutches.  Turn on the lights when you go into a dark area. Replace any light bulbs as soon as they burn out.  Set up your furniture so you have a clear path. Avoid moving your furniture around.  If any of your floors are uneven, fix them.  If there are any pets around you, be aware of where they are.  Review your medicines with your doctor. Some medicines can make you feel dizzy. This can increase your chance of falling. Ask your doctor what other things that you can do to help prevent falls. This information is not intended to replace advice given to you by your health care provider. Make sure you discuss any questions you have with your health care provider. Document Released: 04/19/2009 Document Revised: 11/29/2015 Document Reviewed: 07/28/2014 Elsevier Interactive Patient Education  2017 [employer].

## 2019-09-07 ENCOUNTER — Encounter: Payer: Self-pay | Admitting: Family Medicine

## 2019-09-07 ENCOUNTER — Other Ambulatory Visit: Payer: Self-pay

## 2019-09-07 ENCOUNTER — Ambulatory Visit (INDEPENDENT_AMBULATORY_CARE_PROVIDER_SITE_OTHER): Payer: Medicare HMO | Admitting: Family Medicine

## 2019-09-07 VITALS — BP 120/84 | HR 92 | Temp 96.6°F | Resp 16 | Ht 60.0 in | Wt 198.3 lb

## 2019-09-07 DIAGNOSIS — E559 Vitamin D deficiency, unspecified: Secondary | ICD-10-CM

## 2019-09-07 DIAGNOSIS — M4802 Spinal stenosis, cervical region: Secondary | ICD-10-CM

## 2019-09-07 DIAGNOSIS — R202 Paresthesia of skin: Secondary | ICD-10-CM | POA: Diagnosis not present

## 2019-09-07 DIAGNOSIS — E538 Deficiency of other specified B group vitamins: Secondary | ICD-10-CM | POA: Diagnosis not present

## 2019-09-07 DIAGNOSIS — M170 Bilateral primary osteoarthritis of knee: Secondary | ICD-10-CM | POA: Diagnosis not present

## 2019-09-07 DIAGNOSIS — I700 Atherosclerosis of aorta: Secondary | ICD-10-CM | POA: Diagnosis not present

## 2019-09-07 DIAGNOSIS — H4922 Sixth [abducent] nerve palsy, left eye: Secondary | ICD-10-CM

## 2019-09-07 DIAGNOSIS — D692 Other nonthrombocytopenic purpura: Secondary | ICD-10-CM

## 2019-09-07 DIAGNOSIS — R030 Elevated blood-pressure reading, without diagnosis of hypertension: Secondary | ICD-10-CM | POA: Diagnosis not present

## 2019-09-07 MED ORDER — VITAMIN D 50 MCG (2000 UT) PO CAPS: 1.0000 | ORAL_CAPSULE | Freq: Every day | ORAL | 0 refills | Status: DC

## 2019-09-07 NOTE — Progress Notes (Signed)
Name: Heather Norris   MRN: [id number]    DOB: 1946-02-24   Date:09/07/2019       Progress Note  Subjective  Chief Complaint  Chief Complaint  Patient presents with  . 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   B12: she has lower extremity paresthesia, advised her to resume taking B12 otc and we will recheck levels next visit   DDD cervical spine: with spinal stenosis patient denies neck pain unless looking down to work on a puzzle. Denies tingling, numbness or weakness on her arms, C-spine MRI done at [facility] 03/2019 and reviewed today with patient   Gait difficulty: she has OA of knees , she states pain is mild and constant, she has a cane that she uses when walking long distance, at grocerie stores she uses the cart to support herself. She needs to renew her handicap stickers   Senile purpura she still bruises easily , gave her reassurance  Patient Active Problem List   Diagnosis Date Noted  . Atherosclerosis of aorta ([place]) 01/27/2019  . Senile purpura ([place]) 01/27/2019  . Osteopenia 09/25/2016  . GERD (gastroesophageal reflux disease) 08/13/2016  . Tinnitus of left ear 08/13/2016  . History of bariatric surgery 08/13/2016  . Vitamin D deficiency 08/13/2016  . History of shoulder fracture 08/13/2016  . Osteoarthritis of  both knees 08/13/2016  . Insulin resistance 08/13/2016    Past Surgical History:  Procedure Laterality Date  . CHOLECYSTECTOMY    . COLONOSCOPY  06/25/2011  . GASTRIC BYPASS  1978    Family History  Adopted: Yes  Problem Relation Age of Onset  . Mental illness Daughter        anxiety     Social History   Tobacco Use  . Smoking status: Former Smoker    Packs/day: 2.00    Years: 30.00    Pack years: 60.00    Types: Cigarettes    Start date: 07/07/1956    Quit date: 12/06/2011    Years since quitting: 7.7  . Smokeless tobacco: Never Used  Substance Use Topics  . Alcohol use: No     Current Outpatient Medications:  .  esomeprazole (NEXIUM 24HR) 20 MG capsule, Take 20 mg by mouth at bedtime. OTC, Disp: , Rfl:  .  Multiple Vitamins-Minerals (MULTIVITAMIN WITH MINERALS) tablet, Take 1 tablet by mouth daily. (Patient not taking: Reported on 03/02/2019), Disp: 30 tablet, Rfl: 0 .  vitamin B-12 (CYANOCOBALAMIN) 500 MCG tablet, Take 500 mcg by mouth daily., Disp: , Rfl:   No Known Allergies  I personally reviewed active problem list, medication list, allergies, family history, social history, health maintenance with the patient/caregiver today.   ROS  Constitutional: Negative for fever or weight change.  Respiratory: Negative for cough  and shortness of breath.   Cardiovascular: Negative for chest pain or palpitations.  Gastrointestinal: Negative for abdominal pain, no bowel changes.  Musculoskeletal: Positive for gait problem ( has to stop and rest when walking for along time) or joint swelling.  Skin: Negative for rash.  Neurological: Negative for dizziness or headache.  No other specific complaints in a complete review of systems (except as listed in HPI above).  Objective  Vitals:   09/07/19 0852  BP: 120/84  Pulse: 92  Resp: 16  Temp: (!) 96.6 F (35.9 C)  TempSrc: Temporal  SpO2: 98%  Weight: 198 lb 4.8 oz (89.9 kg)  Height: 5' (1.524 m)    Body mass index  is 38.73 kg/m.  Physical Exam  Constitutional: Patient appears well-developed and well-nourished. Obese  No distress.  HEENT: head atraumatic, normocephalic, pupils equal and reactive to light Cardiovascular: Normal rate, regular rhythm and normal heart sounds.  No murmur heard. No BLE edema. Pulmonary/Chest: Effort normal and breath sounds normal. No respiratory distress. Abdominal: Soft.  There is no tenderness. Muscular skeletal: crepitus with extension of both knees, no redness, slow gait Psychiatric: Patient has a normal mood and affect. behavior is normal. Judgment and thought content normal.   PHQ2/9: Depression screen [facility] 2/9 09/07/2019 09/01/2019 03/02/2019 01/27/2019 08/17/2018  Decreased Interest 0 0 0 0 0  Down, Depressed, Hopeless 0 0 0 0 0  PHQ - 2 Score 0 0 0 0 0  Altered sleeping 0 - 0 0 -  Tired, decreased energy 0 - 0 0 -  Change in appetite 0 - 0 0 -  Feeling bad or failure about yourself  0 - 0 0 -  Trouble concentrating 0 - 0 0 -  Moving slowly or fidgety/restless 0 - 0 0 -  Suicidal thoughts 0 - 0 0 -  PHQ-9 Score 0 - 0 0 -    phq 9 is negative   Fall Risk: Fall Risk  09/07/2019 09/01/2019 03/02/2019 01/27/2019 01/27/2019  Falls in the past year? 0 1 0 0 0  Number falls in past yr: 0 1 0 0 0  Comment - slipped on icy porch, no injury - - -  Injury with Fall? 0 0 0 0 0  Risk for fall due to : - History of fall(s) - - -  Follow up - Falls prevention discussed - - -     Functional Status Survey: Is the patient deaf or have difficulty hearing?: No Does the patient have difficulty seeing, even when wearing glasses/contacts?: No Does the patient have difficulty concentrating, remembering, or making decisions?: No Does the patient have difficulty walking or climbing stairs?: No Does the patient have difficulty dressing or bathing?: No Does the patient have difficulty doing errands alone such as visiting a doctor's office or shopping?: No    Assessment &  Plan  1. Atherosclerosis of aorta (HCC)  Refuses statin therapy   2. Cervical spinal stenosis  No symptoms  3. Sixth nerve palsy of left eye  Doing well with prisma lenses   4. Vitamin D deficiency  Advised vitamin D otc   5. Low vitamin B12 level  Resume B12  6. Senile purpura (HCC)  stable  7. White count syndrome without hypertension   At goal today continue to monitor   8. Primary osteoarthritis of both knees  Handicap form filled out   9. Paresthesia of both feet  Resume B12

## 2019-10-11 ENCOUNTER — Other Ambulatory Visit: Payer: Self-pay | Admitting: Family Medicine

## 2019-10-11 DIAGNOSIS — Z1231 Encounter for screening mammogram for malignant neoplasm of breast: Secondary | ICD-10-CM

## 2019-10-17 ENCOUNTER — Ambulatory Visit: Payer: Medicare HMO | Admitting: Family Medicine

## 2019-11-22 DIAGNOSIS — H4922 Sixth [abducent] nerve palsy, left eye: Secondary | ICD-10-CM | POA: Diagnosis not present

## 2019-11-22 DIAGNOSIS — H43823 Vitreomacular adhesion, bilateral: Secondary | ICD-10-CM | POA: Diagnosis not present

## 2019-12-21 DIAGNOSIS — H2512 Age-related nuclear cataract, left eye: Secondary | ICD-10-CM | POA: Diagnosis not present

## 2019-12-28 ENCOUNTER — Other Ambulatory Visit: Payer: Self-pay

## 2019-12-28 ENCOUNTER — Encounter: Payer: Self-pay | Admitting: Ophthalmology

## 2019-12-29 ENCOUNTER — Encounter: Payer: Self-pay | Admitting: Ophthalmology

## 2020-01-02 ENCOUNTER — Other Ambulatory Visit: Admission: RE | Admit: 2020-01-02 | Payer: Medicare HMO | Source: Ambulatory Visit

## 2020-01-02 NOTE — Discharge Instructions (Signed)

## 2020-01-04 ENCOUNTER — Other Ambulatory Visit: Payer: Self-pay

## 2020-01-04 ENCOUNTER — Encounter
Admission: RE | Disposition: A | Discharge status: Home / Self Care | Payer: Self-pay | Source: Home / Self Care | Attending: Ophthalmology

## 2020-01-04 ENCOUNTER — Encounter: Payer: Self-pay | Admitting: Ophthalmology

## 2020-01-04 ENCOUNTER — Ambulatory Visit: Payer: Medicare HMO | Admitting: Anesthesiology

## 2020-01-04 ENCOUNTER — Ambulatory Visit
Admission: RE | Admit: 2020-01-04 | Discharge: 2020-01-04 | Disposition: A | Discharge status: Home / Self Care | Payer: Medicare HMO | Attending: Ophthalmology | Admitting: Ophthalmology

## 2020-01-04 DIAGNOSIS — H2512 Age-related nuclear cataract, left eye: Secondary | ICD-10-CM | POA: Diagnosis not present

## 2020-01-04 DIAGNOSIS — H25812 Combined forms of age-related cataract, left eye: Secondary | ICD-10-CM | POA: Diagnosis not present

## 2020-01-04 DIAGNOSIS — Z87891 Personal history of nicotine dependence: Secondary | ICD-10-CM | POA: Diagnosis not present

## 2020-01-04 DIAGNOSIS — Z9884 Bariatric surgery status: Secondary | ICD-10-CM | POA: Diagnosis not present

## 2020-01-04 HISTORY — PX: CATARACT EXTRACTION W/PHACO: SHX586

## 2020-01-04 HISTORY — DX: Unspecified osteoarthritis, unspecified site: M19.90

## 2020-01-04 SURGERY — PHACOEMULSIFICATION, CATARACT, WITH IOL INSERTION
Anesthesia: Monitor Anesthesia Care | Site: Eye | Laterality: Left

## 2020-01-04 MED ORDER — BRIMONIDINE TARTRATE-TIMOLOL 0.2-0.5 % OP SOLN
OPHTHALMIC | Status: DC | PRN
  Administered 2020-01-04: 1 [drp] via OPHTHALMIC

## 2020-01-04 MED ORDER — CEFUROXIME OPHTHALMIC INJECTION 1 MG/0.1 ML
INJECTION | OPHTHALMIC | Status: DC | PRN
  Administered 2020-01-04: 0.1 mL via INTRACAMERAL

## 2020-01-04 MED ORDER — EPINEPHRINE PF 1 MG/ML IJ SOLN
INTRAOCULAR | Status: DC | PRN
  Administered 2020-01-04: 73 mL via OPHTHALMIC

## 2020-01-04 MED ORDER — LIDOCAINE HCL (PF) 2 % IJ SOLN
INTRAOCULAR | Status: DC | PRN
  Administered 2020-01-04: 2 mL

## 2020-01-04 MED ORDER — ARMC OPHTHALMIC DILATING DROPS
1.0000 "application " | OPHTHALMIC | Status: DC | PRN
  Administered 2020-01-04 (×3): 1 via OPHTHALMIC

## 2020-01-04 MED ORDER — MIDAZOLAM HCL 2 MG/2ML IJ SOLN
INTRAMUSCULAR | Status: DC | PRN
  Administered 2020-01-04 (×2): .5 mg via INTRAVENOUS

## 2020-01-04 MED ORDER — TETRACAINE HCL 0.5 % OP SOLN
1.0000 [drp] | OPHTHALMIC | Status: DC | PRN
  Administered 2020-01-04 (×3): 1 [drp] via OPHTHALMIC

## 2020-01-04 MED ORDER — FENTANYL CITRATE (PF) 100 MCG/2ML IJ SOLN
INTRAMUSCULAR | Status: DC | PRN
  Administered 2020-01-04: 50 ug via INTRAVENOUS

## 2020-01-04 MED ORDER — NA HYALUR & NA CHOND-NA HYALUR 0.4-0.35 ML IO KIT
PACK | INTRAOCULAR | Status: DC | PRN
  Administered 2020-01-04: 1 mL via INTRAOCULAR

## 2020-01-04 MED ORDER — MOXIFLOXACIN HCL 0.5 % OP SOLN
1.0000 [drp] | OPHTHALMIC | Status: DC | PRN
  Administered 2020-01-04 (×3): 1 [drp] via OPHTHALMIC

## 2020-01-04 SURGICAL SUPPLY — 22 items
CANNULA ANT/CHMB 27G (MISCELLANEOUS) ×1 IMPLANT
CANNULA ANT/CHMB 27GA (MISCELLANEOUS) ×3 IMPLANT
GLOVE SURG LX 7.5 STRW (GLOVE) ×2
GLOVE SURG LX STRL 7.5 STRW (GLOVE) ×1 IMPLANT
GLOVE SURG TRIUMPH 8.0 PF LTX (GLOVE) ×3 IMPLANT
GOWN STRL REUS W/ TWL LRG LVL3 (GOWN DISPOSABLE) ×2 IMPLANT
GOWN STRL REUS W/TWL LRG LVL3 (GOWN DISPOSABLE) ×4
LENS IOL TECNIS ITEC 28.5 (Intraocular Lens) ×2 IMPLANT
MARKER SKIN DUAL TIP RULER LAB (MISCELLANEOUS) ×3 IMPLANT
NDL CAPSULORHEX 25GA (NEEDLE) ×1 IMPLANT
NDL FILTER BLUNT 18X1 1/2 (NEEDLE) ×2 IMPLANT
NEEDLE CAPSULORHEX 25GA (NEEDLE) ×3 IMPLANT
NEEDLE FILTER BLUNT 18X 1/2SAF (NEEDLE) ×4
NEEDLE FILTER BLUNT 18X1 1/2 (NEEDLE) ×2 IMPLANT
PACK CATARACT BRASINGTON (MISCELLANEOUS) ×3 IMPLANT
PACK EYE AFTER SURG (MISCELLANEOUS) ×3 IMPLANT
PACK OPTHALMIC (MISCELLANEOUS) ×3 IMPLANT
SOLUTION OPHTHALMIC SALT (MISCELLANEOUS) ×3 IMPLANT
SYR 3ML LL SCALE MARK (SYRINGE) ×6 IMPLANT
SYR TB 1ML LUER SLIP (SYRINGE) ×3 IMPLANT
WATER STERILE IRR 250ML POUR (IV SOLUTION) ×3 IMPLANT
WIPE NON LINTING 3.25X3.25 (MISCELLANEOUS) ×3 IMPLANT

## 2020-01-04 NOTE — Transfer of Care (Signed)
Immediate Anesthesia Transfer of Care Note  Patient: Heather Norris  Procedure(s) Performed: CATARACT EXTRACTION PHACO AND INTRAOCULAR LENS PLACEMENT (IOC) LEFT 6.43 00:56.9 11.3% (Left Eye)  Patient Location: PACU  Anesthesia Type: MAC  Level of Consciousness: awake, alert  and patient cooperative  Airway and Oxygen Therapy: Patient Spontanous Breathing and Patient connected to supplemental oxygen  Post-op Assessment: Post-op Vital signs reviewed, Patient's Cardiovascular Status Stable, Respiratory Function Stable, Patent Airway and No signs of Nausea or vomiting  Post-op Vital Signs: Reviewed and stable  Complications: No complications documented.

## 2020-01-04 NOTE — H&P (Signed)

## 2020-01-04 NOTE — Op Note (Signed)
OPERATIVE NOTE  Heather Norris [id number] 01/04/2020   PREOPERATIVE DIAGNOSIS:  Nuclear sclerotic cataract left eye. H25.12   POSTOPERATIVE DIAGNOSIS:    Nuclear sclerotic cataract left eye.     PROCEDURE:  Phacoemusification with posterior chamber intraocular lens placement of the left eye  Ultrasound time: Procedure(s): CATARACT EXTRACTION PHACO AND INTRAOCULAR LENS PLACEMENT (IOC) LEFT 6.43 00:56.9 11.3% (Left)  LENS:   Implant Name Type Inv. Item Serial No. Manufacturer Lot No. LRB No. Used Action  LENS IOL DIOP 28.5 - [id number] Intraocular Lens LENS IOL DIOP 28.5 [id number] [employer]  Left 1 Implanted      SURGEON:  Wyonia Hough, MD   ANESTHESIA:  Topical with tetracaine drops and 2% Xylocaine jelly, augmented with 1% preservative-free intracameral lidocaine.    COMPLICATIONS:  None.   DESCRIPTION OF PROCEDURE:  The patient was identified in the holding room and transported to the operating room and placed in the supine position under the operating microscope.  The left eye was identified as the operative eye and it was prepped and draped in the usual sterile ophthalmic fashion.   A 1 millimeter clear-corneal paracentesis was made at the 1:30 position.  0.5 ml of preservative-free 1% lidocaine was injected into the anterior chamber.  The anterior chamber was filled with Viscoat viscoelastic.  A 2.4 millimeter keratome was used to make a near-clear corneal incision at the 10:30 position.  .  A curvilinear capsulorrhexis was made with a cystotome and capsulorrhexis forceps.  Balanced salt solution was used to hydrodissect and hydrodelineate the nucleus.   Phacoemulsification was then used in stop and chop fashion to remove the lens nucleus and epinucleus.  The remaining cortex was then removed using the irrigation and aspiration handpiece. Provisc was then placed into the capsular bag to distend it for lens placement.  A lens was then injected into the  capsular bag.  The remaining viscoelastic was aspirated.   Wounds were hydrated with balanced salt solution.  The anterior chamber was inflated to a physiologic pressure with balanced salt solution.  No wound leaks were noted. Cefuroxime 0.1 ml of a 10mg /ml solution was injected into the anterior chamber for a dose of 1 mg of intracameral antibiotic at the completion of the case.   Timolol and Brimonidine drops were applied to the eye.  The patient was taken to the recovery room in stable condition without complications of anesthesia or surgery.  [name] 01/04/2020, 10:37 AM

## 2020-01-04 NOTE — Anesthesia Postprocedure Evaluation (Signed)
Anesthesia Post Note  Patient: Heather Norris  Procedure(s) Performed: CATARACT EXTRACTION PHACO AND INTRAOCULAR LENS PLACEMENT (IOC) LEFT 6.43 00:56.9 11.3% (Left Eye)     Patient location during evaluation: PACU Anesthesia Type: MAC Level of consciousness: awake and alert Pain management: pain level controlled Vital Signs Assessment: post-procedure vital signs reviewed and stable Respiratory status: spontaneous breathing Cardiovascular status: blood pressure returned to baseline Postop Assessment: no apparent nausea or vomiting, adequate PO intake and no headache Anesthetic complications: no   No complications documented.  Adele Barthel Laylani Pudwill

## 2020-01-04 NOTE — Anesthesia Preprocedure Evaluation (Signed)
Anesthesia Evaluation  Patient identified by MRN, date of birth, ID band Patient awake    History of Anesthesia Complications Negative for: history of anesthetic complications  Airway Mallampati: III  TM Distance: >3 FB Neck ROM: Full    Dental  (+) Edentulous Upper, Edentulous Lower   Pulmonary neg pulmonary ROS, former smoker,    Pulmonary exam normal        Cardiovascular negative cardio ROS Normal cardiovascular exam     Neuro/Psych    GI/Hepatic Neg liver ROS, GERD  ,  Endo/Other  negative endocrine ROS  Renal/GU negative Renal ROS     Musculoskeletal   Abdominal   Peds  Hematology negative hematology ROS (+)   Anesthesia Other Findings   Reproductive/Obstetrics                             Anesthesia Physical Anesthesia Plan  ASA: II  Anesthesia Plan: MAC   Post-op Pain Management:    Induction:   PONV Risk Score and Plan: 2 and TIVA, Midazolam and Treatment may vary due to age or medical condition  Airway Management Planned: Nasal Cannula and Natural Airway  Additional Equipment: None  Intra-op Plan:   Post-operative Plan:   Informed Consent: I have reviewed the patients History and Physical, chart, labs and discussed the procedure including the risks, benefits and alternatives for the proposed anesthesia with the patient or authorized representative who has indicated his/her understanding and acceptance.       Plan Discussed with: CRNA  Anesthesia Plan Comments:         Anesthesia Quick Evaluation

## 2020-01-04 NOTE — Anesthesia Procedure Notes (Signed)
Procedure Name: MAC Date/Time: 01/04/2020 10:18 AM Performed by: Silvana Newness, CRNA Pre-anesthesia Checklist: Patient identified, Emergency Drugs available, Suction available, Patient being monitored and Timeout performed Patient Re-evaluated:Patient Re-evaluated prior to induction Oxygen Delivery Method: Nasal cannula

## 2020-01-05 DIAGNOSIS — H2511 Age-related nuclear cataract, right eye: Secondary | ICD-10-CM | POA: Diagnosis not present

## 2020-01-05 DIAGNOSIS — M1991 Primary osteoarthritis, unspecified site: Secondary | ICD-10-CM | POA: Diagnosis not present

## 2020-01-16 ENCOUNTER — Encounter: Payer: Self-pay | Admitting: Ophthalmology

## 2020-01-20 ENCOUNTER — Telehealth: Payer: Self-pay | Admitting: Family Medicine

## 2020-01-20 NOTE — Chronic Care Management (AMB) (Signed)
  Chronic Care Management   Note  01/20/2020 Name: Heather Norris MRN: [id number] DOB: Jan 19, 1946  [name] is a 74 y.o. year old female who is a primary care patient of Steele Sizer, MD. I reached out to Tora Perches by phone today in response to a referral sent by Ms. Cheral Marker Fusselman's health plan.     Ms. Heine was given information about Chronic Care Management services today including:  1. CCM service includes personalized support from designated clinical staff supervised by her physician, including individualized plan of care and coordination with other care providers 2. 24/7 contact phone numbers for assistance for urgent and routine care needs. 3. Service will only be billed when office clinical staff spend 20 minutes or more in a month to coordinate care. 4. Only one practitioner may furnish and bill the service in a calendar month. 5. The patient may stop CCM services at any time (effective at the end of the month) by phone call to the office staff. 6. The patient will be responsible for cost sharing (co-pay) of up to 20% of the service fee (after annual deductible is met).  Patient did not agree to enrollment in care management services and does not wish to consider at this time.  Follow up plan: The patient has been provided with contact information for the care management team and has been advised to call with any health related questions or concerns.   Noreene Larsson, [place], [place], [place] [address] Direct Dial: [phone] [email or web address]

## 2020-01-23 NOTE — Discharge Instructions (Signed)

## 2020-01-25 ENCOUNTER — Ambulatory Visit
Admission: RE | Admit: 2020-01-25 | Discharge: 2020-01-25 | Disposition: A | Discharge status: Home / Self Care | Payer: Medicare HMO | Attending: Ophthalmology | Admitting: Ophthalmology

## 2020-01-25 ENCOUNTER — Ambulatory Visit: Payer: Medicare HMO | Admitting: Anesthesiology

## 2020-01-25 ENCOUNTER — Encounter
Admission: RE | Disposition: A | Discharge status: Home / Self Care | Payer: Self-pay | Source: Home / Self Care | Attending: Ophthalmology

## 2020-01-25 ENCOUNTER — Encounter: Payer: Self-pay | Admitting: Ophthalmology

## 2020-01-25 ENCOUNTER — Other Ambulatory Visit: Payer: Self-pay

## 2020-01-25 DIAGNOSIS — K219 Gastro-esophageal reflux disease without esophagitis: Secondary | ICD-10-CM | POA: Insufficient documentation

## 2020-01-25 DIAGNOSIS — H25811 Combined forms of age-related cataract, right eye: Secondary | ICD-10-CM | POA: Diagnosis not present

## 2020-01-25 DIAGNOSIS — Z87891 Personal history of nicotine dependence: Secondary | ICD-10-CM | POA: Diagnosis not present

## 2020-01-25 DIAGNOSIS — Z79899 Other long term (current) drug therapy: Secondary | ICD-10-CM | POA: Diagnosis not present

## 2020-01-25 DIAGNOSIS — H2511 Age-related nuclear cataract, right eye: Secondary | ICD-10-CM | POA: Diagnosis not present

## 2020-01-25 HISTORY — PX: CATARACT EXTRACTION W/PHACO: SHX586

## 2020-01-25 SURGERY — PHACOEMULSIFICATION, CATARACT, WITH IOL INSERTION
Anesthesia: Monitor Anesthesia Care | Site: Eye | Laterality: Right

## 2020-01-25 MED ORDER — LACTATED RINGERS IV SOLN: INTRAVENOUS | Status: DC

## 2020-01-25 MED ORDER — MOXIFLOXACIN HCL 0.5 % OP SOLN
1.0000 [drp] | OPHTHALMIC | Status: DC | PRN
  Administered 2020-01-25 (×3): 1 [drp] via OPHTHALMIC

## 2020-01-25 MED ORDER — ARMC OPHTHALMIC DILATING DROPS
1.0000 "application " | OPHTHALMIC | Status: DC | PRN
  Administered 2020-01-25 (×3): 1 via OPHTHALMIC

## 2020-01-25 MED ORDER — LIDOCAINE HCL (PF) 2 % IJ SOLN
INTRAOCULAR | Status: DC | PRN
  Administered 2020-01-25: 1 mL

## 2020-01-25 MED ORDER — FENTANYL CITRATE (PF) 100 MCG/2ML IJ SOLN
INTRAMUSCULAR | Status: DC | PRN
  Administered 2020-01-25 (×2): 50 ug via INTRAVENOUS

## 2020-01-25 MED ORDER — MIDAZOLAM HCL 2 MG/2ML IJ SOLN
INTRAMUSCULAR | Status: DC | PRN
  Administered 2020-01-25 (×2): 1 mg via INTRAVENOUS

## 2020-01-25 MED ORDER — TETRACAINE HCL 0.5 % OP SOLN
1.0000 [drp] | OPHTHALMIC | Status: DC | PRN
  Administered 2020-01-25 (×3): 1 [drp] via OPHTHALMIC

## 2020-01-25 MED ORDER — NA HYALUR & NA CHOND-NA HYALUR 0.4-0.35 ML IO KIT
PACK | INTRAOCULAR | Status: DC | PRN
  Administered 2020-01-25: 1 mL via INTRAOCULAR

## 2020-01-25 MED ORDER — ACETAMINOPHEN 160 MG/5ML PO SOLN: 325.0000 mg | ORAL | Status: DC | PRN

## 2020-01-25 MED ORDER — EPINEPHRINE PF 1 MG/ML IJ SOLN
INTRAOCULAR | Status: DC | PRN
  Administered 2020-01-25: 52 mL via OPHTHALMIC

## 2020-01-25 MED ORDER — BRIMONIDINE TARTRATE-TIMOLOL 0.2-0.5 % OP SOLN
OPHTHALMIC | Status: DC | PRN
  Administered 2020-01-25: 1 [drp] via OPHTHALMIC

## 2020-01-25 MED ORDER — CEFUROXIME OPHTHALMIC INJECTION 1 MG/0.1 ML
INJECTION | OPHTHALMIC | Status: DC | PRN
  Administered 2020-01-25: 0.1 mL via INTRACAMERAL

## 2020-01-25 MED ORDER — ACETAMINOPHEN 325 MG PO TABS: 325.0000 mg | ORAL_TABLET | ORAL | Status: DC | PRN

## 2020-01-25 SURGICAL SUPPLY — 29 items
CANNULA ANT/CHMB 27G (MISCELLANEOUS) ×1 IMPLANT
CANNULA ANT/CHMB 27GA (MISCELLANEOUS) ×3 IMPLANT
GLOVE SURG LX 7.5 STRW (GLOVE) ×4
GLOVE SURG LX STRL 7.5 STRW (GLOVE) ×1 IMPLANT
GLOVE SURG TRIUMPH 8.0 PF LTX (GLOVE) ×3 IMPLANT
GOWN STRL REUS W/ TWL LRG LVL3 (GOWN DISPOSABLE) ×2 IMPLANT
GOWN STRL REUS W/TWL LRG LVL3 (GOWN DISPOSABLE) ×6
LENS IOL TECNIS 28.5 (Intraocular Lens) ×3 IMPLANT
LENS IOL TECNIS MONO 1P 28.5 (Intraocular Lens) IMPLANT
MARKER SKIN DUAL TIP RULER LAB (MISCELLANEOUS) ×3 IMPLANT
NDL CAPSULORHEX 25GA (NEEDLE) ×1 IMPLANT
NDL FILTER BLUNT 18X1 1/2 (NEEDLE) ×2 IMPLANT
NDL RETROBULBAR .5 NSTRL (NEEDLE) IMPLANT
NEEDLE CAPSULORHEX 25GA (NEEDLE) ×3 IMPLANT
NEEDLE FILTER BLUNT 18X 1/2SAF (NEEDLE) ×4
NEEDLE FILTER BLUNT 18X1 1/2 (NEEDLE) ×2 IMPLANT
PACK CATARACT BRASINGTON (MISCELLANEOUS) ×3 IMPLANT
PACK EYE AFTER SURG (MISCELLANEOUS) ×3 IMPLANT
PACK OPTHALMIC (MISCELLANEOUS) ×3 IMPLANT
RING MALYGIN 7.0 (MISCELLANEOUS) IMPLANT
SOLUTION OPHTHALMIC SALT (MISCELLANEOUS) ×3 IMPLANT
SUT ETHILON 10-0 CS-B-6CS-B-6 (SUTURE)
SUT VICRYL  9 0 (SUTURE)
SUT VICRYL 9 0 (SUTURE) IMPLANT
SUTURE EHLN 10-0 CS-B-6CS-B-6 (SUTURE) IMPLANT
SYR 3ML LL SCALE MARK (SYRINGE) ×6 IMPLANT
SYR TB 1ML LUER SLIP (SYRINGE) ×3 IMPLANT
WATER STERILE IRR 250ML POUR (IV SOLUTION) ×3 IMPLANT
WIPE NON LINTING 3.25X3.25 (MISCELLANEOUS) ×3 IMPLANT

## 2020-01-25 NOTE — Anesthesia Procedure Notes (Signed)
Procedure Name: MAC Performed by: Emika Tiano M, CRNA Pre-anesthesia Checklist: Timeout performed, Patient being monitored, Suction available, Emergency Drugs available and Patient identified Patient Re-evaluated:Patient Re-evaluated prior to induction Oxygen Delivery Method: Nasal cannula       

## 2020-01-25 NOTE — H&P (Signed)

## 2020-01-25 NOTE — Op Note (Signed)
LOCATION:  [place]   PREOPERATIVE DIAGNOSIS:    Nuclear sclerotic cataract right eye. H25.11   POSTOPERATIVE DIAGNOSIS:  Nuclear sclerotic cataract right eye.     PROCEDURE:  Phacoemusification with posterior chamber intraocular lens placement of the right eye   ULTRASOUND TIME: Procedure(s): CATARACT EXTRACTION PHACO AND INTRAOCULAR LENS PLACEMENT (IOC) RIGHT 7.47 01:00.0 12.5% (Right)  LENS:   Implant Name Type Inv. Item Serial No. Manufacturer Lot No. LRB No. Used Action  LENS IOL TECNIS 28.5 - [id number] Intraocular Lens LENS IOL TECNIS 28.5 [id number] [employer]  Right 1 Implanted         SURGEON:  Wyonia Hough, MD   ANESTHESIA:  Topical with tetracaine drops and 2% Xylocaine jelly, augmented with 1% preservative-free intracameral lidocaine.    COMPLICATIONS:  None.   DESCRIPTION OF PROCEDURE:  The patient was identified in the holding room and transported to the operating room and placed in the supine position under the operating microscope.  The right eye was identified as the operative eye and it was prepped and draped in the usual sterile ophthalmic fashion.   A 1 millimeter clear-corneal paracentesis was made at the 12:00 position.  0.5 ml of preservative-free 1% lidocaine was injected into the anterior chamber. The anterior chamber was filled with Viscoat viscoelastic.  A 2.4 millimeter keratome was used to make a near-clear corneal incision at the 9:00 position.  A curvilinear capsulorrhexis was made with a cystotome and capsulorrhexis forceps.  Balanced salt solution was used to hydrodissect and hydrodelineate the nucleus.   Phacoemulsification was then used in stop and chop fashion to remove the lens nucleus and epinucleus.  The remaining cortex was then removed using the irrigation and aspiration handpiece. Provisc was then placed into the capsular bag to distend it for lens placement.  A lens was then injected into the capsular bag.   The remaining viscoelastic was aspirated.   Wounds were hydrated with balanced salt solution.  The anterior chamber was inflated to a physiologic pressure with balanced salt solution.  No wound leaks were noted. Cefuroxime 0.1 ml of a 10mg /ml solution was injected into the anterior chamber for a dose of 1 mg of intracameral antibiotic at the completion of the case.   Timolol and Brimonidine drops were applied to the eye.  The patient was taken to the recovery room in stable condition without complications of anesthesia or surgery.   Kaylia Winborne 01/25/2020, 9:32 AM

## 2020-01-25 NOTE — Transfer of Care (Signed)
Immediate Anesthesia Transfer of Care Note  Patient: Heather Norris  Procedure(s) Performed: CATARACT EXTRACTION PHACO AND INTRAOCULAR LENS PLACEMENT (IOC) RIGHT 7.47 01:00.0 12.5% (Right Eye)  Patient Location: PACU  Anesthesia Type: MAC  Level of Consciousness: awake, alert  and patient cooperative  Airway and Oxygen Therapy: Patient Spontanous Breathing and Patient connected to supplemental oxygen  Post-op Assessment: Post-op Vital signs reviewed, Patient's Cardiovascular Status Stable, Respiratory Function Stable, Patent Airway and No signs of Nausea or vomiting  Post-op Vital Signs: Reviewed and stable  Complications: No complications documented.

## 2020-01-25 NOTE — Anesthesia Postprocedure Evaluation (Signed)
Anesthesia Post Note  Patient: NAMITA YEARWOOD  Procedure(s) Performed: CATARACT EXTRACTION PHACO AND INTRAOCULAR LENS PLACEMENT (IOC) RIGHT 7.47 01:00.0 12.5% (Right Eye)     Patient location during evaluation: PACU Anesthesia Type: MAC Level of consciousness: awake and alert Pain management: pain level controlled Vital Signs Assessment: post-procedure vital signs reviewed and stable Respiratory status: spontaneous breathing, nonlabored ventilation, respiratory function stable and patient connected to nasal cannula oxygen Cardiovascular status: stable and blood pressure returned to baseline Postop Assessment: no apparent nausea or vomiting Anesthetic complications: no   No complications documented.  Trecia Rogers

## 2020-01-25 NOTE — Anesthesia Preprocedure Evaluation (Signed)
Anesthesia Evaluation  Patient identified by MRN, date of birth, ID band Patient awake    Reviewed: Allergy & Precautions, H&P , NPO status , Patient's Chart, lab work & pertinent test results, reviewed documented beta blocker date and time   Airway Mallampati: II  TM Distance: >3 FB Neck ROM: full    Dental no notable dental hx.    Pulmonary neg pulmonary ROS, former smoker,    Pulmonary exam normal breath sounds clear to auscultation       Cardiovascular Exercise Tolerance: Good negative cardio ROS Normal cardiovascular exam Rhythm:regular Rate:Normal     Neuro/Psych negative neurological ROS  negative psych ROS   GI/Hepatic Neg liver ROS, GERD  Controlled,  Endo/Other  negative endocrine ROS  Renal/GU negative Renal ROS  negative genitourinary   Musculoskeletal   Abdominal   Peds  Hematology negative hematology ROS (+)   Anesthesia Other Findings   Reproductive/Obstetrics negative OB ROS                             Anesthesia Physical Anesthesia Plan  ASA: II  Anesthesia Plan: MAC   Post-op Pain Management:    Induction:   PONV Risk Score and Plan:   Airway Management Planned:   Additional Equipment:   Intra-op Plan:   Post-operative Plan:   Informed Consent: I have reviewed the patients History and Physical, chart, labs and discussed the procedure including the risks, benefits and alternatives for the proposed anesthesia with the patient or authorized representative who has indicated his/her understanding and acceptance.     Dental Advisory Given  Plan Discussed with: CRNA  Anesthesia Plan Comments:         Anesthesia Quick Evaluation

## 2020-01-26 ENCOUNTER — Encounter: Payer: Self-pay | Admitting: Ophthalmology

## 2020-02-27 DIAGNOSIS — Z961 Presence of intraocular lens: Secondary | ICD-10-CM | POA: Diagnosis not present

## 2020-03-06 NOTE — Progress Notes (Signed)
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 Unchanged , doing much better now   Atherosclerosis : she refused taking atorvastatin, discussed labs done last visit with patient , we will recheck labs today   GERD: she takes otc Nexium and controls her heartburn , she denies dysphagia or indigestion. She takes medication at most twice a week   B12: she has lower extremity paresthesia, advised her to resume taking B12 otc and we will recheck levels today   DDD cervical spine: with spinal stenosis patient denies neck pain unless looking down to work on a puzzle. Denies tingling, numbness or weakness on her arms, C-spine MRI done at [facility] 03/2019 , she states no recent problems, doing better with head positioning   Gait difficulty: she has OA of knees , she states pain is mild and constant, she has a cane that she uses when walking long distance but did not use it today.   Senile purpura she still bruises easily , gave her reassurance. Unchanged  Skin lesion on left temporal area: going on for over 6 months, she uses topical medication without help, discussed need for biopsy but she refuses it , explained it may be skin cancer  Osteopenia on Bone density done 2018, she does not want to have it repeated, risk of fracture is high, discussed medications and she is willing to try fosamax , discussed the importance of not reclining after dose, take in am, and side effects, she does not have any teeth, she is already on vitamin D supplementation, discussed high calcium diet    She refuses flu shot, pneumonia vaccine and mammogram   Patient Active Problem List   Diagnosis Date Noted  . Atherosclerosis of aorta ([place]) 01/27/2019  . Senile purpura ([place]) 01/27/2019  . Osteopenia 09/25/2016  . GERD (gastroesophageal reflux disease) 08/13/2016  . Tinnitus of left ear 08/13/2016  . History of bariatric surgery 08/13/2016  . Vitamin D deficiency 08/13/2016  . History of shoulder fracture 08/13/2016  . Osteoarthritis of both knees 08/13/2016  . Insulin resistance 08/13/2016    Past Surgical History:  Procedure Laterality Date  . CATARACT EXTRACTION W/PHACO Left 01/04/2020   Procedure: CATARACT EXTRACTION PHACO AND INTRAOCULAR LENS PLACEMENT (IOC) LEFT 6.43 00:56.9 11.3%;  Surgeon: Leandrew Koyanagi, MD;  Location: [place];  Service: Ophthalmology;  Laterality: Left;  . CATARACT EXTRACTION W/PHACO Right 01/25/2020   Procedure: CATARACT EXTRACTION PHACO AND INTRAOCULAR LENS PLACEMENT (IOC) RIGHT 7.47 01:00.0 12.5%;  Surgeon: Leandrew Koyanagi, MD;  Location: [place];  Service: Ophthalmology;  Laterality: Right;  . CHOLECYSTECTOMY    . COLONOSCOPY  06/25/2011  . GASTRIC BYPASS  1978    Family History  Adopted: Yes  Problem Relation Age of Onset  . Mental illness Daughter        anxiety     Social History   Tobacco Use  . Smoking status: Former Smoker    Packs/day: 2.00    Years: 30.00  Pack years: 60.00    Types: Cigarettes    Start date: 07/07/1956    Quit date: 12/06/2011    Years since quitting: 8.2  . Smokeless tobacco: Never Used  Substance Use Topics  . Alcohol use: No     Current Outpatient Medications:  .  Acetaminophen (ARTHRITIS PAIN RELIEF PO), Take 650 mg by mouth as needed., Disp: , Rfl:  .  Ascorbic Acid (VITAMIN C) 100 MG tablet, Take by mouth., Disp: , Rfl:  .  Calcium Carb-Cholecalciferol (CALCIUM 600 + D PO), Take by mouth daily., Disp: , Rfl:  .  esomeprazole (NEXIUM 24HR) 20 MG capsule, Take 20 mg  by mouth at bedtime. OTC, Disp: , Rfl:  .  Magnesium 500 MG TABS, Take by mouth daily., Disp: , Rfl:  .  Magnesium Chloride 64 MG TABS, , Disp: , Rfl:  .  Turmeric 500 MG CAPS, Take by mouth., Disp: , Rfl:  .  TURMERIC CURCUMIN PO, Take by mouth daily., Disp: , Rfl:  .  vitamin B-12 (CYANOCOBALAMIN) 500 MCG tablet, Take 1,000 mcg by mouth daily. , Disp: , Rfl:  .  vitamin C (ASCORBIC ACID) 250 MG tablet, Take 500 mg by mouth daily., Disp: , Rfl:  .  alendronate (FOSAMAX) 70 MG tablet, Take 1 tablet (70 mg total) by mouth every 7 (seven) days. Take with a full glass of water on an empty stomach., Disp: 4 tablet, Rfl: 11  No Known Allergies  I personally reviewed active problem list, medication list, allergies, family history, social history, health maintenance with the patient/caregiver today.   ROS  Constitutional: Negative for fever or weight change.  Respiratory: Negative for cough and shortness of breath.   Cardiovascular: Negative for chest pain or palpitations.  Gastrointestinal: Negative for abdominal pain, no bowel changes.  Musculoskeletal: Negative for gait problem or joint swelling.  Skin: Negative for rash.  Neurological: Negative for dizziness or headache.  No other specific complaints in a complete review of systems (except as listed in HPI above).  Objective  Vitals:   03/08/20 1127  BP: 128/78  Pulse: 95  Resp: 16  Temp: 97.9 F (36.6 C)  TempSrc: Oral  SpO2: 94%  Weight: 197 lb 1.6 oz (89.4 kg)  Height: 5' (1.524 m)    Body mass index is 38.49 kg/m.  Physical Exam  Constitutional: Patient appears well-developed and well-nourished. Obese No distress.  HEENT: head atraumatic, normocephalic, pupils equal and reactive to light, neck supple Cardiovascular: Normal rate, regular rhythm and normal heart sounds.  No murmur heard. No BLE edema. Pulmonary/Chest: Effort normal and breath sounds normal. No respiratory distress. Abdominal: Soft.  There is no  tenderness. Psychiatric: Patient has a normal mood and affect. behavior is normal. Judgment and thought content normal.  PHQ2/9: Depression screen [facility] 2/9 03/08/2020 09/07/2019 09/01/2019 03/02/2019 01/27/2019  Decreased Interest 0 0 0 0 0  Down, Depressed, Hopeless 0 0 0 0 0  PHQ - 2 Score 0 0 0 0 0  Altered sleeping - 0 - 0 0  Tired, decreased energy - 0 - 0 0  Change in appetite - 0 - 0 0  Feeling bad or failure about yourself  - 0 - 0 0  Trouble concentrating - 0 - 0 0  Moving slowly or fidgety/restless - 0 - 0 0  Suicidal thoughts - 0 - 0 0  PHQ-9 Score - 0 - 0 0    phq 9 is negative   Fall Risk: Fall Risk  03/08/2020  09/07/2019 09/01/2019 03/02/2019 01/27/2019  Falls in the past year? 0 0 1 0 0  Number falls in past yr: 0 0 1 0 0  Comment - - slipped on icy porch, no injury - -  Injury with Fall? 0 0 0 0 0  Risk for fall due to : - - History of fall(s) - -  Follow up - - Falls prevention discussed - -     Functional Status Survey: Is the patient deaf or have difficulty hearing?: No Does the patient have difficulty concentrating, remembering, or making decisions?: No Does the patient have difficulty walking or climbing stairs?: Yes Does the patient have difficulty dressing or bathing?: No Does the patient have difficulty doing errands alone such as visiting a doctor's office or shopping?: No   Assessment & Plan  1. Senile purpura (HCC)  Both arms  2. Atherosclerosis of aorta (H[facility])  - Lipid panel  3. Need for immunization against influenza  - Flu Vaccine QUAD 36+ mos IM  4. Cervical spinal stenosis  Stable  5. Low vitamin B12 level  - CBC with Differential/Platelet - Vitamin B12  6. Vitamin D deficiency  - VITAMIN D 25 Hydroxy (Vit-D Deficiency, Fractures)  7. Osteopenia, unspecified location  - COMPLETE METABOLIC PANEL WITH GFR - alendronate (FOSAMAX) 70 MG tablet; Take 1 tablet (70 mg total) by mouth every 7 (seven) days. Take with a full glass of water on  an empty stomach.  Dispense: 4 tablet; Refill: 11  8. History of bariatric surgery  - COMPLETE METABOLIC PANEL WITH GFR  9. Primary osteoarthritis of both knees  stable  10. White coat syndrome without hypertension  Doing well today  11. Insulin resistance  - Hemoglobin A1c  12. Skin lesion of face  Refuses skin biopsy

## 2020-03-08 ENCOUNTER — Encounter: Payer: Self-pay | Admitting: Family Medicine

## 2020-03-08 ENCOUNTER — Other Ambulatory Visit: Payer: Self-pay

## 2020-03-08 ENCOUNTER — Ambulatory Visit (INDEPENDENT_AMBULATORY_CARE_PROVIDER_SITE_OTHER): Payer: Medicare HMO | Admitting: Family Medicine

## 2020-03-08 VITALS — BP 128/78 | HR 95 | Temp 97.9°F | Resp 16 | Ht 60.0 in | Wt 197.1 lb

## 2020-03-08 DIAGNOSIS — Z23 Encounter for immunization: Secondary | ICD-10-CM

## 2020-03-08 DIAGNOSIS — E8881 Metabolic syndrome: Secondary | ICD-10-CM | POA: Diagnosis not present

## 2020-03-08 DIAGNOSIS — E559 Vitamin D deficiency, unspecified: Secondary | ICD-10-CM | POA: Diagnosis not present

## 2020-03-08 DIAGNOSIS — E538 Deficiency of other specified B group vitamins: Secondary | ICD-10-CM | POA: Diagnosis not present

## 2020-03-08 DIAGNOSIS — D692 Other nonthrombocytopenic purpura: Secondary | ICD-10-CM | POA: Diagnosis not present

## 2020-03-08 DIAGNOSIS — Z9884 Bariatric surgery status: Secondary | ICD-10-CM | POA: Diagnosis not present

## 2020-03-08 DIAGNOSIS — R030 Elevated blood-pressure reading, without diagnosis of hypertension: Secondary | ICD-10-CM

## 2020-03-08 DIAGNOSIS — M170 Bilateral primary osteoarthritis of knee: Secondary | ICD-10-CM | POA: Diagnosis not present

## 2020-03-08 DIAGNOSIS — E88819 Insulin resistance, unspecified: Secondary | ICD-10-CM

## 2020-03-08 DIAGNOSIS — M4802 Spinal stenosis, cervical region: Secondary | ICD-10-CM | POA: Diagnosis not present

## 2020-03-08 DIAGNOSIS — R739 Hyperglycemia, unspecified: Secondary | ICD-10-CM | POA: Diagnosis not present

## 2020-03-08 DIAGNOSIS — L989 Disorder of the skin and subcutaneous tissue, unspecified: Secondary | ICD-10-CM

## 2020-03-08 DIAGNOSIS — R7989 Other specified abnormal findings of blood chemistry: Secondary | ICD-10-CM

## 2020-03-08 DIAGNOSIS — M8580 Other specified disorders of bone density and structure, unspecified site: Secondary | ICD-10-CM | POA: Diagnosis not present

## 2020-03-08 DIAGNOSIS — I700 Atherosclerosis of aorta: Secondary | ICD-10-CM

## 2020-03-08 MED ORDER — ALENDRONATE SODIUM 70 MG PO TABS: 70.0000 mg | ORAL_TABLET | ORAL | 11 refills | Status: DC

## 2020-03-09 ENCOUNTER — Ambulatory Visit: Payer: Medicare HMO | Admitting: Family Medicine

## 2020-03-09 LAB — CBC WITH DIFFERENTIAL/PLATELET
Absolute Monocytes: 457 cells/uL (ref 200–950)
Basophils Absolute: 50 cells/uL (ref 0–200)
Basophils Relative: 0.9 %
Eosinophils Absolute: 50 cells/uL (ref 15–500)
Eosinophils Relative: 0.9 %
HCT: 42.7 % (ref 35.0–45.0)
Hemoglobin: 13.7 g/dL (ref 11.7–15.5)
Lymphs Abs: 2019 cells/uL (ref 850–3900)
MCH: 28.8 pg (ref 27.0–33.0)
MCHC: 32.1 g/dL (ref 32.0–36.0)
MCV: 89.7 fL (ref 80.0–100.0)
MPV: 10.9 fL (ref 7.5–12.5)
Monocytes Relative: 8.3 %
Neutro Abs: 2926 cells/uL (ref 1500–7800)
Neutrophils Relative %: 53.2 %
Platelets: 248 10*3/uL (ref 140–400)
RBC: 4.76 10*6/uL (ref 3.80–5.10)
RDW: 12.8 % (ref 11.0–15.0)
Total Lymphocyte: 36.7 %
WBC: 5.5 10*3/uL (ref 3.8–10.8)

## 2020-03-09 LAB — LIPID PANEL
Cholesterol: 216 mg/dL — ABNORMAL HIGH (ref <200)
HDL: 67 mg/dL (ref >=50)
LDL Cholesterol (Calc): 128 mg/dL (calc) — ABNORMAL HIGH
Non-HDL Cholesterol (Calc): 149 mg/dL (calc) — ABNORMAL HIGH (ref <130)
Total CHOL/HDL Ratio: 3.2 (calc) (ref <5.0)
Triglycerides: 107 mg/dL (ref <150)

## 2020-03-09 LAB — COMPLETE METABOLIC PANEL WITH GFR
AG Ratio: 1.6 (calc) (ref 1.0–2.5)
ALT: 7 U/L (ref 6–29)
AST: 19 U/L (ref 10–35)
Albumin: 4.2 g/dL (ref 3.6–5.1)
Alkaline phosphatase (APISO): 58 U/L (ref 37–153)
BUN: 14 mg/dL (ref 7–25)
CO2: 25 mmol/L (ref 20–32)
Calcium: 9.7 mg/dL (ref 8.6–10.4)
Chloride: 105 mmol/L (ref 98–110)
Creat: 0.89 mg/dL (ref 0.60–0.93)
GFR, Est African American: 73 mL/min/{1.73_m2} (ref >=60)
GFR, Est Non African American: 63 mL/min/{1.73_m2} (ref >=60)
Globulin: 2.7 g/dL (calc) (ref 1.9–3.7)
Glucose, Bld: 95 mg/dL (ref 65–99)
Potassium: 5.1 mmol/L (ref 3.5–5.3)
Sodium: 140 mmol/L (ref 135–146)
Total Bilirubin: 0.6 mg/dL (ref 0.2–1.2)
Total Protein: 6.9 g/dL (ref 6.1–8.1)

## 2020-03-09 LAB — HEMOGLOBIN A1C
Hgb A1c MFr Bld: 5.6 % of total Hgb (ref <5.7)
Mean Plasma Glucose: 114 (calc)
eAG (mmol/L): 6.3 (calc)

## 2020-03-09 LAB — VITAMIN D 25 HYDROXY (VIT D DEFICIENCY, FRACTURES): Vit D, 25-Hydroxy: 50 ng/mL (ref 30–100)

## 2020-03-09 LAB — VITAMIN B12: Vitamin B-12: 460 pg/mL (ref 200–1100)

## 2020-03-26 ENCOUNTER — Other Ambulatory Visit: Payer: Self-pay | Admitting: Family Medicine

## 2020-03-26 DIAGNOSIS — M8580 Other specified disorders of bone density and structure, unspecified site: Secondary | ICD-10-CM

## 2020-03-26 DIAGNOSIS — E2839 Other primary ovarian failure: Secondary | ICD-10-CM

## 2020-08-27 DIAGNOSIS — H43823 Vitreomacular adhesion, bilateral: Secondary | ICD-10-CM | POA: Diagnosis not present

## 2020-09-04 ENCOUNTER — Ambulatory Visit (INDEPENDENT_AMBULATORY_CARE_PROVIDER_SITE_OTHER): Payer: Medicare HMO

## 2020-09-04 ENCOUNTER — Other Ambulatory Visit: Payer: Self-pay

## 2020-09-04 VITALS — BP 132/78 | HR 94 | Temp 98.1°F | Resp 16 | Ht 60.0 in | Wt 195.7 lb

## 2020-09-04 DIAGNOSIS — Z0000 Encounter for general adult medical examination without abnormal findings: Secondary | ICD-10-CM | POA: Diagnosis not present

## 2020-09-04 NOTE — Progress Notes (Signed)
Subjective:   Heather Norris is a 75 y.o. female who presents for Medicare Annual (Subsequent) preventive examination.  Review of Systems     Cardiac Risk Factors include: advanced age (>5men, >25 women);obesity (BMI >30kg/m2)     Objective:    Today's Vitals   09/04/20 1337 09/04/20 1340  BP: 132/78   Pulse: 94   Resp: 16   Temp: 98.1 F (36.7 C)   TempSrc: Oral   Weight: 195 lb 11.2 oz (88.8 kg)   Height: 5' (1.524 m)   PainSc:  3    Body mass index is 38.22 kg/m.  Advanced Directives 09/04/2020 01/25/2020 01/04/2020 08/17/2018 08/11/2017 08/13/2016  Does Patient Have a Medical Advance Directive? Yes Yes Yes No No No  Type of Paramedic of [place];Living will [place];Living will [place] - - -  Does patient want to make changes to medical advance directive? - No - Patient declined No - Patient declined - - -  Copy of [place] in Chart? Yes - validated most recent copy scanned in chart (See row information) Yes - validated most recent copy scanned in chart (See row information) No - copy requested - - -  Would patient like information on creating a medical advance directive? - - - Yes (MAU/Ambulatory/Procedural Areas - Information given) Yes (MAU/Ambulatory/Procedural Areas - Information given) -    Current Medications (verified) Outpatient Encounter Medications as of 09/04/2020  Medication Sig  . Acetaminophen (ARTHRITIS PAIN RELIEF PO) Take 650 mg by mouth as needed.  . APPLE CIDER VINEGAR PO Take 1 tablet by mouth daily.  . Calcium-Magnesium-Zinc (RA CALCIUM/MAGNESIUM/ZINC) 333-133-5 MG TABS Take 3 tablets by mouth daily.  Marland Kitchen esomeprazole (NEXIUM) 20 MG capsule Take 20 mg by mouth at bedtime. OTC  . Magnesium 500 MG TABS Take by mouth daily.  . Turmeric 500 MG CAPS Take by mouth.  . vitamin B-12 (CYANOCOBALAMIN) 500 MCG tablet Take 1,000 mcg by mouth daily.   . vitamin C (ASCORBIC ACID)  250 MG tablet Take 500 mg by mouth daily.  . Vitamin D, Cholecalciferol, 10 MCG (400 UNIT) TABS Take 2 tablets by mouth daily.  . [DISCONTINUED] Ascorbic Acid (VITAMIN C) 100 MG tablet Take 500 mg by mouth.  Marland Kitchen alendronate (FOSAMAX) 70 MG tablet Take 1 tablet (70 mg total) by mouth every 7 (seven) days. Take with a full glass of water on an empty stomach. (Patient not taking: Reported on 09/04/2020)  . [DISCONTINUED] Calcium Carb-Cholecalciferol (CALCIUM 600 + D PO) Take by mouth daily.  . [DISCONTINUED] Magnesium Chloride 64 MG TABS   . [DISCONTINUED] TURMERIC CURCUMIN PO Take by mouth daily.   No facility-administered encounter medications on file as of 09/04/2020.    Allergies (verified) Patient has no known allergies.   History: Past Medical History:  Diagnosis Date  . Arthritis    knees  . GERD (gastroesophageal reflux disease)   . Osteopenia   . Tinnitus of left ear    Past Surgical History:  Procedure Laterality Date  . CATARACT EXTRACTION W/PHACO Left 01/04/2020   Procedure: CATARACT EXTRACTION PHACO AND INTRAOCULAR LENS PLACEMENT (IOC) LEFT 6.43 00:56.9 11.3%;  Surgeon: Leandrew Koyanagi, MD;  Location: [place];  Service: Ophthalmology;  Laterality: Left;  . CATARACT EXTRACTION W/PHACO Right 01/25/2020   Procedure: CATARACT EXTRACTION PHACO AND INTRAOCULAR LENS PLACEMENT (IOC) RIGHT 7.47 01:00.0 12.5%;  Surgeon: Leandrew Koyanagi, MD;  Location: [place];  Service: Ophthalmology;  Laterality:  Right;  Marland Kitchen CHOLECYSTECTOMY    . COLONOSCOPY  06/25/2011  . GASTRIC BYPASS  1978   Family History  Adopted: Yes  Problem Relation Age of Onset  . Mental illness Daughter        anxiety    Social History   Socioeconomic History  . Marital status: Widowed    Spouse name: Iona Beard  . Number of children: 1  . Years of education: Not on file  . Highest education level: 7th grade  Occupational History  . Occupation: Retired  Tobacco Use  . Smoking status:  Former Smoker    Packs/day: 2.00    Years: 30.00    Pack years: 60.00    Types: Cigarettes    Start date: 07/07/1956    Quit date: 12/06/2011    Years since quitting: 8.7  . Smokeless tobacco: Never Used  Vaping Use  . Vaping Use: Never used  Substance and Sexual Activity  . Alcohol use: No  . Drug use: No  . Sexual activity: Not Currently  Other Topics Concern  . Not on file  Social History Narrative   She has been a widow since 1989   She has not dated since.    She has one child, [name].   Pt lives with a roommate.    Social Determinants of Health   Financial Resource Strain: Low Risk   . Difficulty of Paying Living Expenses: Not hard at all  Food Insecurity: No Food Insecurity  . Worried About Charity fundraiser in the Last Year: Never true  . Ran Out of Food in the Last Year: Never true  Transportation Needs: No Transportation Needs  . Lack of Transportation (Medical): No  . Lack of Transportation (Non-Medical): No  Physical Activity: Inactive  . Days of Exercise per Week: 0 days  . Minutes of Exercise per Session: 0 min  Stress: No Stress Concern Present  . Feeling of Stress : Not at all  Social Connections: Socially Isolated  . Frequency of Communication with Friends and Family: More than three times a week  . Frequency of Social Gatherings with Friends and Family: Twice a week  . Attends Religious Services: Never  . Active Member of Clubs or Organizations: No  . Attends Archivist Meetings: Never  . Marital Status: Widowed    Tobacco Counseling Counseling given: Not Answered   Clinical Intake:  Pre-visit preparation completed: Yes  Pain : 0-10 Pain Score: 3  Pain Type: Chronic pain (arthritis) Pain Location: Knee Pain Orientation: Right,Left Pain Descriptors / Indicators: Aching,Sore Pain Onset: More than a month ago Pain Frequency: Constant     BMI - recorded: 38.22 Nutritional Status: BMI > 30  Obese Nutritional Risks: None Diabetes:  No  How often do you need to have someone help you when you read instructions, pamphlets, or other written materials from your doctor or pharmacy?: 1 - Never   Interpreter Needed?: No  Information entered by :: Clemetine Marker LPN   Activities of Daily Living In your present state of health, do you have any difficulty performing the following activities: 09/04/2020 03/08/2020  Hearing? N N  Comment declines hearing aids -  Vision? N -  Difficulty concentrating or making decisions? N N  Walking or climbing stairs? N Y  Dressing or bathing? N N  Doing errands, shopping? N N  Preparing Food and eating ? N -  Using the Toilet? N -  In the past six months, have you accidently leaked urine?  N -  Do you have problems with loss of bowel control? N -  Managing your Medications? N -  Managing your Finances? N -  Housekeeping or managing your Housekeeping? N -  Some recent data might be hidden    Patient Care Team: Steele Sizer, MD as PCP - General (Family Medicine)  Indicate any recent Medical Services you may have received from other than [facility] providers in the past year (date may be approximate).     Assessment:   This is a routine wellness examination for Heather Norris.  Hearing/Vision screen  Hearing Screening   125Hz  250Hz  500Hz  1000Hz  2000Hz  3000Hz  4000Hz  6000Hz  8000Hz   Right ear:           Left ear:           Comments: Pt denies hearing difficulty  Vision Screening Comments: Annual vision screenings done [facility]  Dietary issues and exercise activities discussed: Current Exercise Habits: The patient does not participate in regular exercise at present, Exercise limited by: None identified  Goals    . DIET - INCREASE WATER INTAKE     Recommend to drink at least 6-8 8oz glasses of water per day.      Depression Screen PHQ 2/9 Scores 09/04/2020 03/08/2020 09/07/2019 09/01/2019 03/02/2019 01/27/2019 08/17/2018  PHQ - 2 Score 0 0 0 0 0 0 0  PHQ- 9 Score - - 0 - 0 0 -     Fall Risk Fall Risk  09/04/2020 03/08/2020 09/07/2019 09/01/2019 03/02/2019  Falls in the past year? 0 0 0 1 0  Number falls in past yr: 0 0 0 1 0  Comment - - - slipped on icy porch, no injury -  Injury with Fall? 0 0 0 0 0  Risk for fall due to : No Fall Risks - - History of fall(s) -  Follow up Falls prevention discussed - - Falls prevention discussed -    FALL RISK PREVENTION PERTAINING TO THE HOME:  Any stairs in or around the home? No  If so, are there any without handrails? No  Home free of loose throw rugs in walkways, pet beds, electrical cords, etc? Yes  Adequate lighting in your home to reduce risk of falls? Yes   ASSISTIVE DEVICES UTILIZED TO PREVENT FALLS:  Life alert? No  Use of a cane, walker or w/c? No  Grab bars in the bathroom? No  Shower chair or bench in shower? No  Elevated toilet seat or a handicapped toilet? Yes   TIMED UP AND GO:  Was the test performed? Yes .  Length of time to ambulate 10 feet: 5 sec.   Gait steady and fast without use of assistive device  Cognitive Function:     6CIT Screen 09/04/2020 09/01/2019 08/17/2018 08/11/2017  What Year? 0 points 0 points 0 points 0 points  What month? 0 points 0 points 0 points 0 points  What time? 0 points 0 points 0 points 0 points  Count back from 20 0 points 0 points 0 points 0 points  Months in reverse 0 points 0 points 2 points 2 points  Repeat phrase 0 points 2 points 0 points 4 points  Total Score 0 2 2 6     Immunizations Immunization History  Administered Date(s) Administered  . Influenza,inj,Quad PF,6+ Mos 03/08/2020  . Janssen (J&J) SARS-COV-2 Vaccination 01/02/2020  . Pneumococcal Conjugate-13 08/13/2016  . Pneumococcal-Unspecified 06/11/2011  . Td 06/11/2011    TDAP status: Up to date  Flu Vaccine  status: Up to date  Pneumococcal vaccine status: Up to date  Covid-19 vaccine status: Completed vaccines  Qualifies for Shingles Vaccine? Yes   Zostavax completed No   Shingrix Completed?:  No.    Education has been provided regarding the importance of this vaccine. Patient has been advised to call insurance company to determine out of pocket expense if they have not yet received this vaccine. Advised may also receive vaccine at local pharmacy or Health Dept. Verbalized acceptance and understanding.  Screening Tests Health Maintenance  Topic Date Due  . MAMMOGRAM  09/23/2017  . COVID-19 Vaccine (2 - Booster for [employer] series) 02/27/2020  . TETANUS/TDAP  06/10/2021  . INFLUENZA VACCINE  Completed  . DEXA SCAN  Completed  . Hepatitis C Screening  Completed  . PNA vac Low Risk Adult  Completed  . HPV VACCINES  Aged Out    Health Maintenance  Health Maintenance Due  Topic Date Due  . MAMMOGRAM  09/23/2017  . COVID-19 Vaccine (2 - Booster for Janssen series) 02/27/2020    Colorectal cancer screening: No longer required.   Mammogram status: Completed 09/23/16. Repeat every year  Bone Density status: Completed 09/23/16. Results reflect: Bone density results: OSTEOPENIA. Repeat every 2 years.  Lung Cancer Screening: (Low Dose CT Chest recommended if Age 74-80 years, 30 pack-year currently smoking OR have quit w/in 15years.) does qualify. Completed 09/10/17 declines repeat screening.   Additional Screening:  Hepatitis C Screening: does qualify; Completed 08/13/16  Vision Screening: Recommended annual ophthalmology exams for early detection of glaucoma and other disorders of the eye. Is the patient up to date with their annual eye exam?  Yes  Who is the provider or what is the name of the office in which the patient attends annual eye exams? [place] Screening: Recommended annual dental exams for proper oral hygiene  Community Resource Referral / Chronic Care Management: CRR required this visit?  No   CCM required this visit?  No      Plan:     I have personally reviewed and noted the following in the patient's chart:   . Medical and social  history . Use of alcohol, tobacco or illicit drugs  . Current medications and supplements . Functional ability and status . Nutritional status . Physical activity . Advanced directives . List of other physicians . Hospitalizations, surgeries, and ER visits in previous 12 months . Vitals . Screenings to include cognitive, depression, and falls . Referrals and appointments  In addition, I have reviewed and discussed with patient certain preventive protocols, quality metrics, and best practice recommendations. A written personalized care plan for preventive services as well as general preventive health recommendations were provided to patient.     Clemetine Marker, LPN   10/10/6254   Nurse Notes: Pt c/o bilateral knee pain with arthritis and neuropathy "stinging pins and needles pain" in legs and feet at time with numbness in feet.   Pt also states she stopped taking the alendronate 3 weeks after prescribed in 03-2020 due to causing nausea and upset stomach. Pt is taking a calcium magnesium zinc supplement but only taking 1 tablet per day instead of the 3 tablet serving size. Pt is also taking 800 IU vit d. Pt encouraged to complete bone density screening and mammogram with phone # provided to call to schedule.

## 2020-09-04 NOTE — Patient Instructions (Signed)
Heather Norris , Thank you for taking time to come for your Medicare Wellness Visit. I appreciate your ongoing commitment to your health goals. Please review the following plan we discussed and let me know if I can assist you in the future.   Screening recommendations/referrals: Colonoscopy: no longer required Mammogram: done 09/23/16. Please call [phone] to schedule your mammogram and bone density screening.  Bone Density: done 09/23/16 Recommended yearly ophthalmology/optometry visit for glaucoma screening and checkup Recommended yearly dental visit for hygiene and checkup  Vaccinations: Influenza vaccine: done 03/08/20 Pneumococcal vaccine: done 08/13/16 Tdap vaccine: done 06/11/11 Shingles vaccine: Shingrix discussed. Please contact your pharmacy for coverage information.  Covid-19: done 01/02/20 due for booster vaccine  Conditions/risks identified:  Recommend drinking 6-8 glasses of water per day and increasing physical activity   Next appointment: Follow up in one year for your annual wellness visit    Preventive Care 65 Years and Older, Female Preventive care refers to lifestyle choices and visits with your health care provider that can promote health and wellness. What does preventive care include?  A yearly physical exam. This is also called an annual well check.  Dental exams once or twice a year.  Routine eye exams. Ask your health care provider how often you should have your eyes checked.  Personal lifestyle choices, including:  Daily care of your teeth and gums.  Regular physical activity.  Eating a healthy diet.  Avoiding tobacco and drug use.  Limiting alcohol use.  Practicing safe sex.  Taking low-dose aspirin every day.  Taking vitamin and mineral supplements as recommended by your health care provider. What happens during an annual well check? The services and screenings done by your health care provider during your annual well check will depend on your  age, overall health, lifestyle risk factors, and family history of disease. Counseling  Your health care provider may ask you questions about your:  Alcohol use.  Tobacco use.  Drug use.  Emotional well-being.  Home and relationship well-being.  Sexual activity.  Eating habits.  History of falls.  Memory and ability to understand (cognition).  Work and work Statistician.  Reproductive health. Screening  You may have the following tests or measurements:  Height, weight, and BMI.  Blood pressure.  Lipid and cholesterol levels. These may be checked every 5 years, or more frequently if you are over 48 years old.  Skin check.  Lung cancer screening. You may have this screening every year starting at age 74 if you have a 30-pack-year history of smoking and currently smoke or have quit within the past 15 years.  Fecal occult blood test (FOBT) of the stool. You may have this test every year starting at age 81.  Flexible sigmoidoscopy or colonoscopy. You may have a sigmoidoscopy every 5 years or a colonoscopy every 10 years starting at age 31.  Hepatitis C blood test.  Hepatitis B blood test.  Sexually transmitted disease (STD) testing.  Diabetes screening. This is done by checking your blood sugar (glucose) after you have not eaten for a while (fasting). You may have this done every 1-3 years.  Bone density scan. This is done to screen for osteoporosis. You may have this done starting at age 75.  Mammogram. This may be done every 1-2 years. Talk to your health care provider about how often you should have regular mammograms. Talk with your health care provider about your test results, treatment options, and if necessary, the need for more tests. Vaccines  Your  health care provider may recommend certain vaccines, such as:  Influenza vaccine. This is recommended every year.  Tetanus, diphtheria, and acellular pertussis (Tdap, Td) vaccine. You may need a Td booster  every 10 years.  Zoster vaccine. You may need this after age 40.  Pneumococcal 13-valent conjugate (PCV13) vaccine. One dose is recommended after age 13.  Pneumococcal polysaccharide (PPSV23) vaccine. One dose is recommended after age 36. Talk to your health care provider about which screenings and vaccines you need and how often you need them. This information is not intended to replace advice given to you by your health care provider. Make sure you discuss any questions you have with your health care provider. Document Released: 07/20/2015 Document Revised: 03/12/2016 Document Reviewed: 04/24/2015 Elsevier Interactive Patient Education  2017 [employer] Prevention in the Home Falls can cause injuries. They can happen to people of all ages. There are many things you can do to make your home safe and to help prevent falls. What can I do on the outside of my home?  Regularly fix the edges of walkways and driveways and fix any cracks.  Remove anything that might make you trip as you walk through a door, such as a raised step or threshold.  Trim any bushes or trees on the path to your home.  Use bright outdoor lighting.  Clear any walking paths of anything that might make someone trip, such as rocks or tools.  Regularly check to see if handrails are loose or broken. Make sure that both sides of any steps have handrails.  Any raised decks and porches should have guardrails on the edges.  Have any leaves, snow, or ice cleared regularly.  Use sand or salt on walking paths during winter.  Clean up any spills in your garage right away. This includes oil or grease spills. What can I do in the bathroom?  Use night lights.  Install grab bars by the toilet and in the tub and shower. Do not use towel bars as grab bars.  Use non-skid mats or decals in the tub or shower.  If you need to sit down in the shower, use a plastic, non-slip stool.  Keep the floor dry. Clean up any  water that spills on the floor as soon as it happens.  Remove soap buildup in the tub or shower regularly.  Attach bath mats securely with double-sided non-slip rug tape.  Do not have throw rugs and other things on the floor that can make you trip. What can I do in the bedroom?  Use night lights.  Make sure that you have a light by your bed that is easy to reach.  Do not use any sheets or blankets that are too big for your bed. They should not hang down onto the floor.  Have a firm chair that has side arms. You can use this for support while you get dressed.  Do not have throw rugs and other things on the floor that can make you trip. What can I do in the kitchen?  Clean up any spills right away.  Avoid walking on wet floors.  Keep items that you use a lot in easy-to-reach places.  If you need to reach something above you, use a strong step stool that has a grab bar.  Keep electrical cords out of the way.  Do not use floor polish or wax that makes floors slippery. If you must use wax, use non-skid floor wax.  Do not  have throw rugs and other things on the floor that can make you trip. What can I do with my stairs?  Do not leave any items on the stairs.  Make sure that there are handrails on both sides of the stairs and use them. Fix handrails that are broken or loose. Make sure that handrails are as long as the stairways.  Check any carpeting to make sure that it is firmly attached to the stairs. Fix any carpet that is loose or worn.  Avoid having throw rugs at the top or bottom of the stairs. If you do have throw rugs, attach them to the floor with carpet tape.  Make sure that you have a light switch at the top of the stairs and the bottom of the stairs. If you do not have them, ask someone to add them for you. What else can I do to help prevent falls?  Wear shoes that:  Do not have high heels.  Have rubber bottoms.  Are comfortable and fit you well.  Are closed  at the toe. Do not wear sandals.  If you use a stepladder:  Make sure that it is fully opened. Do not climb a closed stepladder.  Make sure that both sides of the stepladder are locked into place.  Ask someone to hold it for you, if possible.  Clearly mark and make sure that you can see:  Any grab bars or handrails.  First and last steps.  Where the edge of each step is.  Use tools that help you move around (mobility aids) if they are needed. These include:  Canes.  Walkers.  Scooters.  Crutches.  Turn on the lights when you go into a dark area. Replace any light bulbs as soon as they burn out.  Set up your furniture so you have a clear path. Avoid moving your furniture around.  If any of your floors are uneven, fix them.  If there are any pets around you, be aware of where they are.  Review your medicines with your doctor. Some medicines can make you feel dizzy. This can increase your chance of falling. Ask your doctor what other things that you can do to help prevent falls. This information is not intended to replace advice given to you by your health care provider. Make sure you discuss any questions you have with your health care provider. Document Released: 04/19/2009 Document Revised: 11/29/2015 Document Reviewed: 07/28/2014 Elsevier Interactive Patient Education  2017 [employer].

## 2021-03-07 NOTE — Progress Notes (Signed)
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 Unchanged , doing much better now , only needs to wear her prism glasses when reading or doing word search   Atherosclerosis : she refused taking atorvastatin, LDL was 128 , goal is below 70  GERD: she takes otc Nexium and controls her heartburn , she denies dysphagia or indigestion. At most every other week.   Decubitus ulcer: she has noticed a dry spot on her right buttocks going on for months, using moisturizer advised to see Dermatologist but she is not interested   B12/peripheral neuropathy : she has lower extremity paresthesia but it is stable but is still bothersome, she does not like anyone touching her legs , taking supplementation   DDD cervical spine: with spinal stenosis patient denies neck pain unless looking down to work on a puzzle. Denies tingling, numbness or weakness on her arms, C-spine [facility] done at [facility] 03/2019 , she states no recent problems.   Gait difficulty: she has OA of knees , she states pain is mild and constant, she has a cane, aching pain . She did not use her cane today   Weight loss: she states eating smaller portions, she has been trying to lose weight. Explained she is not up to date with her cancer screening but not interested in having mammogram at this time   Senile purpura she still bruises easily , gave her reassurance. Stable   Skin lesion on left temporal area: going on for over 12  months, she uses topical medication without help, discussed need for biopsy but she refuses it ,  explained it may be skin cancer. Also advised follow up with dermatologist but she also refused. She has been picking on the lesions and has a small laceration on the left temporal area    Osteopenia on Bone density done 2018, she does not want to have it repeated, risk of fracture is high, we gave her fosamax on her last visit but she stopped because it caused nausea, discussed other options but she refused   Patient Active Problem List   Diagnosis Date Noted   Atherosclerosis of aorta ([place]) 01/27/2019   Senile purpura ([place]) 01/27/2019   Osteopenia 09/25/2016   GERD (gastroesophageal reflux disease) 08/13/2016   Tinnitus of left ear 08/13/2016   History of bariatric surgery 08/13/2016   Vitamin D deficiency 08/13/2016   History of shoulder fracture 08/13/2016   Osteoarthritis of both knees 08/13/2016   Insulin resistance 08/13/2016    Past Surgical History:  Procedure Laterality Date   CATARACT EXTRACTION W/PHACO Left 01/04/2020   Procedure: CATARACT EXTRACTION PHACO AND INTRAOCULAR LENS PLACEMENT (IOC) LEFT 6.43 00:56.9 11.3%;  Surgeon: Leandrew Koyanagi, MD;  Location: [place];  Service: Ophthalmology;  Laterality: Left;   CATARACT EXTRACTION W/PHACO Right 01/25/2020   Procedure: CATARACT EXTRACTION PHACO AND INTRAOCULAR LENS PLACEMENT (IOC) RIGHT 7.47 01:00.0 12.5%;  Surgeon: Leandrew Koyanagi, MD;  Location: [place];  Service: Ophthalmology;  Laterality: Right;   CHOLECYSTECTOMY  COLONOSCOPY  06/25/2011   GASTRIC BYPASS  1978    Family History  Adopted: Yes  Problem Relation Age of Onset   Mental illness Daughter        anxiety     Social History   Tobacco Use   Smoking status: Former    Packs/day: 2.00    Years: 30.00    Pack years: 60.00    Types: Cigarettes    Start date: 07/07/1956    Quit date: 12/06/2011    Years since quitting: 9.2   Smokeless tobacco: Never  Substance Use Topics   Alcohol use: No     Current Outpatient  Medications:    Acetaminophen (ARTHRITIS PAIN RELIEF PO), Take 650 mg by mouth as needed., Disp: , Rfl:    APPLE CIDER VINEGAR PO, Take 1 tablet by mouth daily., Disp: , Rfl:    Calcium-Magnesium-Zinc 333-133-5 MG TABS, Take 3 tablets by mouth daily., Disp: , Rfl:    esomeprazole (NEXIUM) 20 MG capsule, Take 20 mg by mouth at bedtime. OTC, Disp: , Rfl:    Magnesium 500 MG TABS, Take by mouth daily., Disp: , Rfl:    Turmeric 500 MG CAPS, Take by mouth., Disp: , Rfl:    vitamin B-12 (CYANOCOBALAMIN) 500 MCG tablet, Take 1,000 mcg by mouth daily. , Disp: , Rfl:    vitamin C (ASCORBIC ACID) 250 MG tablet, Take 500 mg by mouth daily., Disp: , Rfl:    Vitamin D, Cholecalciferol, 10 MCG (400 UNIT) TABS, Take 2 tablets by mouth daily., Disp: , Rfl:   No Known Allergies  I personally reviewed active problem list, medication list, allergies, family history, social history with the patient/caregiver today.   ROS  Constitutional: Negative for fever , positive for weight change.  Respiratory: Negative for cough , positive for shortness of breath with activity .   Cardiovascular: Negative for chest pain or palpitations.  Gastrointestinal: Negative for abdominal pain, no bowel changes.  Musculoskeletal: positive  for gait problem and joint swelling.  Skin: positive  for rash.  Neurological: Negative for dizziness or headache.  No other specific complaints in a complete review of systems (except as listed in HPI above).   Objective  Vitals:   03/08/21 1314  BP: 128/74  Pulse: 98  Resp: 16  Temp: 98.4 F (36.9 C)  TempSrc: Oral  SpO2: 99%  Weight: 184 lb 1.6 oz (83.5 kg)  Height: 5' (1.524 m)    Body mass index is 35.95 kg/m.  Physical Exam  Constitutional: Patient appears well-developed and well-nourished. Obese  No distress.  HEENT: head atraumatic, normocephalic, pupils equal and reactive to light, neck supple Cardiovascular: Normal rate, regular rhythm and normal heart sounds.  No  murmur heard. No BLE edema. Pulmonary/Chest: Effort normal and breath sounds normal. No respiratory distress. Abdominal: Soft.  There is no tenderness. Skin: erythematous patch with area of laceration in the center , also erythema and dry patch on right medial buttocks Psychiatric: Patient has a normal mood and affect. behavior is normal. Judgment and thought content normal.   PHQ2/9: Depression screen [facility] 2/9 03/08/2021 09/04/2020 03/08/2020 09/07/2019 09/01/2019  Decreased Interest 2 0 0 0 0  Down, Depressed, Hopeless 0 0 0 0 0  PHQ - 2 Score 2 0 0 0 0  Altered sleeping 0 - - 0 -  Tired, decreased energy 0 - - 0 -  Change in appetite 0 - - 0 -  Feeling bad or failure about yourself  0 - -  0 -  Trouble concentrating 0 - - 0 -  Moving slowly or fidgety/restless 0 - - 0 -  Suicidal thoughts 0 - - 0 -  PHQ-9 Score 2 - - 0 -  Difficult doing work/chores Not difficult at all - - - -    phq 9 is negative   Fall Risk: Fall Risk  03/08/2021 09/04/2020 03/08/2020 09/07/2019 09/01/2019  Falls in the past year? 0 0 0 0 1  Number falls in past yr: 0 0 0 0 1  Comment - - - - slipped on icy porch, no injury  Injury with Fall? - 0 0 0 0  Risk for fall due to : - No Fall Risks - - History of fall(s)  Follow up - Falls prevention discussed - - Falls prevention discussed      Assessment & Plan  1. Senile purpura ([place])   2. Atherosclerosis of aorta ([facility])  Refuses statin therapy  3. Low vitamin B12 level   4. Pressure injury of right buttock, stage 1   5. Cervical spinal stenosis   6. Vitamin D deficiency   7. History of bariatric surgery   8. Primary osteoarthritis of both knees   9. Skin lesion of face   10. Paresthesia of both feet  She would like to try Lyrica, discussed possible side effects and how to titrate medication, daughter states she will help her  20. Osteopenia, unspecified location   12. Weight loss  - CBC with Differential/Platelet - COMPLETE METABOLIC PANEL  WITH GFR - TSH  13. Need for Tdap vaccination  - Tdap vaccine greater than or equal to 7yo IM  14. Excoriation of face, initial encounter  - Tdap vaccine greater than or equal to 7yo IM

## 2021-03-08 ENCOUNTER — Ambulatory Visit (INDEPENDENT_AMBULATORY_CARE_PROVIDER_SITE_OTHER): Payer: Medicare HMO | Admitting: Family Medicine

## 2021-03-08 ENCOUNTER — Encounter: Payer: Self-pay | Admitting: Family Medicine

## 2021-03-08 ENCOUNTER — Other Ambulatory Visit: Payer: Self-pay

## 2021-03-08 VITALS — BP 128/74 | HR 98 | Temp 98.4°F | Resp 16 | Ht 60.0 in | Wt 184.1 lb

## 2021-03-08 DIAGNOSIS — L989 Disorder of the skin and subcutaneous tissue, unspecified: Secondary | ICD-10-CM | POA: Diagnosis not present

## 2021-03-08 DIAGNOSIS — E538 Deficiency of other specified B group vitamins: Secondary | ICD-10-CM

## 2021-03-08 DIAGNOSIS — R634 Abnormal weight loss: Secondary | ICD-10-CM

## 2021-03-08 DIAGNOSIS — E559 Vitamin D deficiency, unspecified: Secondary | ICD-10-CM | POA: Diagnosis not present

## 2021-03-08 DIAGNOSIS — L89311 Pressure ulcer of right buttock, stage 1: Secondary | ICD-10-CM

## 2021-03-08 DIAGNOSIS — Z23 Encounter for immunization: Secondary | ICD-10-CM

## 2021-03-08 DIAGNOSIS — M170 Bilateral primary osteoarthritis of knee: Secondary | ICD-10-CM

## 2021-03-08 DIAGNOSIS — M4802 Spinal stenosis, cervical region: Secondary | ICD-10-CM | POA: Diagnosis not present

## 2021-03-08 DIAGNOSIS — S0081XA Abrasion of other part of head, initial encounter: Secondary | ICD-10-CM | POA: Diagnosis not present

## 2021-03-08 DIAGNOSIS — Z9884 Bariatric surgery status: Secondary | ICD-10-CM

## 2021-03-08 DIAGNOSIS — D692 Other nonthrombocytopenic purpura: Secondary | ICD-10-CM | POA: Diagnosis not present

## 2021-03-08 DIAGNOSIS — I700 Atherosclerosis of aorta: Secondary | ICD-10-CM | POA: Diagnosis not present

## 2021-03-08 DIAGNOSIS — G609 Hereditary and idiopathic neuropathy, unspecified: Secondary | ICD-10-CM

## 2021-03-08 DIAGNOSIS — M8580 Other specified disorders of bone density and structure, unspecified site: Secondary | ICD-10-CM

## 2021-03-08 MED ORDER — PREGABALIN 50 MG PO CAPS: 50.0000 mg | ORAL_CAPSULE | Freq: Three times a day (TID) | ORAL | 0 refills | Status: DC

## 2021-03-09 LAB — CBC WITH DIFFERENTIAL/PLATELET
Absolute Monocytes: 442 cells/uL (ref 200–950)
Basophils Absolute: 52 cells/uL (ref 0–200)
Basophils Relative: 1 %
Eosinophils Absolute: 52 cells/uL (ref 15–500)
Eosinophils Relative: 1 %
HCT: 41.5 % (ref 35.0–45.0)
Hemoglobin: 13.5 g/dL (ref 11.7–15.5)
Lymphs Abs: 1825 cells/uL (ref 850–3900)
MCH: 28.5 pg (ref 27.0–33.0)
MCHC: 32.5 g/dL (ref 32.0–36.0)
MCV: 87.7 fL (ref 80.0–100.0)
MPV: 11 fL (ref 7.5–12.5)
Monocytes Relative: 8.5 %
Neutro Abs: 2829 cells/uL (ref 1500–7800)
Neutrophils Relative %: 54.4 %
Platelets: 238 10*3/uL (ref 140–400)
RBC: 4.73 10*6/uL (ref 3.80–5.10)
RDW: 13.2 % (ref 11.0–15.0)
Total Lymphocyte: 35.1 %
WBC: 5.2 10*3/uL (ref 3.8–10.8)

## 2021-03-09 LAB — COMPLETE METABOLIC PANEL WITH GFR
AG Ratio: 1.6 (calc) (ref 1.0–2.5)
ALT: 8 U/L (ref 6–29)
AST: 16 U/L (ref 10–35)
Albumin: 4.1 g/dL (ref 3.6–5.1)
Alkaline phosphatase (APISO): 60 U/L (ref 37–153)
BUN: 14 mg/dL (ref 7–25)
CO2: 25 mmol/L (ref 20–32)
Calcium: 9.6 mg/dL (ref 8.6–10.4)
Chloride: 107 mmol/L (ref 98–110)
Creat: 0.87 mg/dL (ref 0.60–1.00)
Globulin: 2.6 g/dL (calc) (ref 1.9–3.7)
Glucose, Bld: 92 mg/dL (ref 65–99)
Potassium: 4.4 mmol/L (ref 3.5–5.3)
Sodium: 141 mmol/L (ref 135–146)
Total Bilirubin: 0.7 mg/dL (ref 0.2–1.2)
Total Protein: 6.7 g/dL (ref 6.1–8.1)
eGFR: 69 mL/min/{1.73_m2} (ref >=60)

## 2021-03-09 LAB — TSH: TSH: 2.69 mIU/L (ref 0.40–4.50)

## 2021-03-26 ENCOUNTER — Telehealth: Payer: Self-pay

## 2021-03-26 NOTE — Telephone Encounter (Signed)
Spoke to patient, relayed labs per Dr. Ancil Boozer. Patient verbalized understanding and had no questions or concerns to express at this time.

## 2021-03-26 NOTE — Telephone Encounter (Signed)
Copied from [place] [phone]. Topic: General - Other >> Mar 26, 2021  1:18 PM Leward Quan A wrote: Reason for CRM: Patient called in asking the nurse to please give her a call with her lab results she does not know how to access My Chart. Can be reached at Ph# [phone]

## 2021-08-29 DIAGNOSIS — D3131 Benign neoplasm of right choroid: Secondary | ICD-10-CM | POA: Diagnosis not present

## 2021-08-29 DIAGNOSIS — H524 Presbyopia: Secondary | ICD-10-CM | POA: Diagnosis not present

## 2021-08-29 DIAGNOSIS — Z961 Presence of intraocular lens: Secondary | ICD-10-CM | POA: Diagnosis not present

## 2021-09-05 ENCOUNTER — Ambulatory Visit (INDEPENDENT_AMBULATORY_CARE_PROVIDER_SITE_OTHER): Payer: Medicare HMO

## 2021-09-05 VITALS — BP 130/84 | HR 77 | Temp 98.4°F | Resp 16 | Ht 60.0 in | Wt 185.4 lb

## 2021-09-05 DIAGNOSIS — Z0000 Encounter for general adult medical examination without abnormal findings: Secondary | ICD-10-CM

## 2021-09-05 NOTE — Progress Notes (Signed)
Subjective:   Heather Norris is a 76 y.o. female who presents for Medicare Annual (Subsequent) preventive examination.  Review of Systems     Cardiac Risk Factors include: advanced age (>64men, >3 women);obesity (BMI >30kg/m2)     Objective:    Today's Vitals   09/05/21 1332 09/05/21 1335  BP: 130/84   Pulse: 77   Resp: 16   Temp: 98.4 F (36.9 C)   TempSrc: Oral   SpO2: 98%   Weight: 185 lb 6.4 oz (84.1 kg)   Height: 5' (1.524 m)   PainSc:  5    Body mass index is 36.21 kg/m.  Advanced Directives 09/05/2021 09/04/2020 01/25/2020 01/04/2020 08/17/2018 08/11/2017 08/13/2016  Does Patient Have a Medical Advance Directive? Yes Yes Yes Yes No No No  Type of Paramedic of [place];Living will [place];Living will [place];Living will [place] - - -  Does patient want to make changes to medical advance directive? - - No - Patient declined No - Patient declined - - -  Copy of [place] in Chart? Yes - validated most recent copy scanned in chart (See row information) Yes - validated most recent copy scanned in chart (See row information) Yes - validated most recent copy scanned in chart (See row information) No - copy requested - - -  Would patient like information on creating a medical advance directive? - - - - Yes (MAU/Ambulatory/Procedural Areas - Information given) Yes (MAU/Ambulatory/Procedural Areas - Information given) -    Current Medications (verified) Outpatient Encounter Medications as of 09/05/2021  Medication Sig   Acetaminophen (ARTHRITIS PAIN RELIEF PO) Take 650 mg by mouth as needed.   APPLE CIDER VINEGAR PO Take 1 tablet by mouth daily.   Calcium-Magnesium-Zinc 333-133-5 MG TABS Take 3 tablets by mouth daily.   esomeprazole (NEXIUM) 20 MG capsule Take 20 mg by mouth at bedtime. OTC   Magnesium 500 MG TABS Take by mouth daily.   Turmeric 500 MG CAPS Take by mouth.    vitamin B-12 (CYANOCOBALAMIN) 500 MCG tablet Take 1,000 mcg by mouth daily.    vitamin C (ASCORBIC ACID) 250 MG tablet Take 500 mg by mouth daily.   Vitamin D, Cholecalciferol, 10 MCG (400 UNIT) TABS Take 2 tablets by mouth daily.   [DISCONTINUED] pregabalin (LYRICA) 50 MG capsule Take 1-3 capsules (50-150 mg total) by mouth 3 (three) times daily. Go up by one capsule every 3 days to max of 150 mg TID   No facility-administered encounter medications on file as of 09/05/2021.    Allergies (verified) Patient has no known allergies.   History: Past Medical History:  Diagnosis Date   Arthritis    knees   GERD (gastroesophageal reflux disease)    Osteopenia    Tinnitus of left ear    Past Surgical History:  Procedure Laterality Date   CATARACT EXTRACTION W/PHACO Left 01/04/2020   Procedure: CATARACT EXTRACTION PHACO AND INTRAOCULAR LENS PLACEMENT (IOC) LEFT 6.43 00:56.9 11.3%;  Surgeon: Leandrew Koyanagi, MD;  Location: [facility];  Service: Ophthalmology;  Laterality: Left;   CATARACT EXTRACTION W/PHACO Right 01/25/2020   Procedure: CATARACT EXTRACTION PHACO AND INTRAOCULAR LENS PLACEMENT (IOC) RIGHT 7.47 01:00.0 12.5%;  Surgeon: Leandrew Koyanagi, MD;  Location: [facility];  Service: Ophthalmology;  Laterality: Right;   CHOLECYSTECTOMY     COLONOSCOPY  06/25/2011   GASTRIC BYPASS  1978   Family History  Adopted: Yes  Problem Relation  Age of Onset   Mental illness Daughter        anxiety    Social History   Socioeconomic History   Marital status: Widowed    Spouse name: Iona Beard   Number of children: 1   Years of education: Not on file   Highest education level: 7th grade  Occupational History   Occupation: Retired  Tobacco Use   Smoking status: Former    Packs/day: 2.00    Years: 30.00    Pack years: 60.00    Types: Cigarettes    Start date: 07/07/1956    Quit date: 12/06/2011    Years since quitting: 9.7   Smokeless tobacco: Never  Vaping Use    Vaping Use: Never used  Substance and Sexual Activity   Alcohol use: No   Drug use: No   Sexual activity: Not Currently  Other Topics Concern   Not on file  Social History Narrative   She has been a widow since 1989   She has not dated since.    She has one child, [name].   Pt lives with a roommate.    Social Determinants of Health   Financial Resource Strain: Low Risk    Difficulty of Paying Living Expenses: Not hard at all  Food Insecurity: No Food Insecurity   Worried About Charity fundraiser in the Last Year: Never true   [place] in the Last Year: Never true  Transportation Needs: No Transportation Needs   Lack of Transportation (Medical): No   Lack of Transportation (Non-Medical): No  Physical Activity: Inactive   Days of Exercise per Week: 0 days   Minutes of Exercise per Session: 0 min  Stress: No Stress Concern Present   Feeling of Stress : Not at all  Social Connections: Socially Isolated   Frequency of Communication with Friends and Family: More than three times a week   Frequency of Social Gatherings with Friends and Family: Twice a week   Attends Religious Services: Never   Marine scientist or Organizations: No   Attends Archivist Meetings: Never   Marital Status: Widowed    Tobacco Counseling Counseling given: Not Answered   Clinical Intake:  Pre-visit preparation completed: Yes  Pain : 0-10 Pain Score: 5  Pain Type: Chronic pain Pain Location: Knee Pain Orientation: Right, Left Pain Descriptors / Indicators: Aching, Sore Pain Onset: More than a month ago Pain Frequency: Intermittent     BMI - recorded: 36.21 Nutritional Status: BMI > 30  Obese Nutritional Risks: None Diabetes: No  How often do you need to have someone help you when you read instructions, pamphlets, or other written materials from your doctor or pharmacy?: 1 - Never    Interpreter Needed?: No  Information entered by :: Clemetine Marker  LPN   Activities of Daily Living In your present state of health, do you have any difficulty performing the following activities: 09/05/2021 03/08/2021  Hearing? N N  Vision? N N  Difficulty concentrating or making decisions? N N  Walking or climbing stairs? N Y  Dressing or bathing? N N  Doing errands, shopping? N Y  Conservation officer, nature and eating ? N -  Using the Toilet? N -  In the past six months, have you accidently leaked urine? N -  Do you have problems with loss of bowel control? N -  Managing your Medications? N -  Managing your Finances? N -  Housekeeping or managing your Housekeeping? N -  Some recent data might be hidden    Patient Care Team: Steele Sizer, MD as PCP - General (Family Medicine)  Indicate any recent Medical Services you may have received from other than [facility] providers in the past year (date may be approximate).     Assessment:   This is a routine wellness examination for Dorr.  Hearing/Vision screen Hearing Screening - Comments:: Pt denies hearing difficulty Vision Screening - Comments:: [facility] screen[facility]  Dietary issues and exercise activities discussed: Current Exercise Habits: The patient does not participate in regular exercise at present, Exercise limited by: orthopedic condition(s)   Goals Addressed             This Visit's Progress    DIET - INCREASE WATER INTAKE   Not on track    Recommend to drink at least 6-8 8oz glasses of water per day.       Depression Screen PHQ 2/9 Scores 09/05/2021 03/08/2021 09/04/2020 03/08/2020 09/07/2019 09/01/2019 03/02/2019  PHQ - 2 Score 0 2 0 0 0 0 0  PHQ- 9 Score 0 2 - - 0 - 0    Fall Risk Fall Risk  09/05/2021 03/08/2021 09/04/2020 03/08/2020 09/07/2019  Falls in the past year? 0 0 0 0 0  Number falls in past yr: 0 0 0 0 0  Comment - - - - -  Injury with Fall? 0 - 0 0 0  Risk for fall due to : No Fall Risks - No Fall Risks - -  Follow up Falls prevention discussed - Falls  prevention discussed - -    FALL RISK PREVENTION PERTAINING TO THE HOME:  Any stairs in or around the home? No  If so, are there any without handrails? No  Home free of loose throw rugs in walkways, pet beds, electrical cords, etc? Yes  Adequate lighting in your home to reduce risk of falls? Yes   ASSISTIVE DEVICES UTILIZED TO PREVENT FALLS:  Life alert? No  Use of a cane, walker or w/c? No  Grab bars in the bathroom? Yes  Shower chair or bench in shower? No  Elevated toilet seat or a handicapped toilet? Yes   TIMED UP AND GO:  Was the test performed? Yes .  Length of time to ambulate 10 feet: 5 sec.   Gait steady and fast without use of assistive device  Cognitive Function: Normal cognitive status assessed by direct observation by this Nurse Health Advisor. No abnormalities found.       6CIT Screen 09/04/2020 09/01/2019 08/17/2018 08/11/2017  What Year? 0 points 0 points 0 points 0 points  What month? 0 points 0 points 0 points 0 points  What time? 0 points 0 points 0 points 0 points  Count back from 20 0 points 0 points 0 points 0 points  Months in reverse 0 points 0 points 2 points 2 points  Repeat phrase 0 points 2 points 0 points 4 points  Total Score 0 2 2 6     Immunizations Immunization History  Administered Date(s) Administered   Influenza,inj,Quad PF,6+ Mos 03/08/2020   Janssen (J&J) SARS-COV-2 Vaccination 01/02/2020   Pneumococcal Conjugate-13 08/13/2016   Pneumococcal Polysaccharide-23 06/11/2011   Td 06/11/2011   Tdap 03/08/2021    TDAP status: Up to date  Flu Vaccine status: Declined, Education has been provided regarding the importance of this vaccine but patient still declined. Advised may receive this vaccine at local pharmacy or Health Dept. Aware to provide a copy of  the vaccination record if obtained from local pharmacy or Health Dept. Verbalized acceptance and understanding.  Pneumococcal vaccine status: Up to date  Covid-19 vaccine status:  Completed vaccines  Qualifies for Shingles Vaccine? Yes   Zostavax completed No   Shingrix Completed?: No.    Education has been provided regarding the importance of this vaccine. Patient has been advised to call insurance company to determine out of pocket expense if they have not yet received this vaccine. Advised may also receive vaccine at local pharmacy or Health Dept. Verbalized acceptance and understanding.  Screening Tests Health Maintenance  Topic Date Due   Zoster Vaccines- Shingrix (1 of 2) Never done   MAMMOGRAM  09/23/2017   COVID-19 Vaccine (2 - Booster for Janssen series) 02/27/2020   INFLUENZA VACCINE  10/04/2021 (Originally 02/04/2021)   TETANUS/TDAP  03/09/2031   Pneumonia Vaccine 70+ Years old  Completed   DEXA SCAN  Completed   Hepatitis C Screening  Completed   HPV VACCINES  Aged Out   COLONOSCOPY (Pts 45-40yrs Insurance coverage will need to be confirmed)  Discontinued    Health Maintenance  Health Maintenance Due  Topic Date Due   Zoster Vaccines- Shingrix (1 of 2) Never done   MAMMOGRAM  09/23/2017   COVID-19 Vaccine (2 - Booster for Janssen series) 02/27/2020    Colorectal cancer screening: No longer required.   Mammogram status: No longer required due to age.  Bone density status: no longer required due to age.   Lung Cancer Screening: (Low Dose CT Chest recommended if Age 71-80 years, 30 pack-year currently smoking OR have quit w/in 15years.) does not qualify.   Additional Screening:  Hepatitis C Screening: does qualify; Completed 08/13/16  Vision Screening: Recommended annual ophthalmology exams for early detection of glaucoma and other disorders of the eye. Is the patient up to date with their annual eye exam?  Yes  Who is the provider or what is the name of the office in which the patient attends annual eye exams? [facility] Screening: Recommended annual dental exams for proper oral hygiene  Community Resource Referral /  Chronic Care Management: CRR required this visit?  No   CCM required this visit?  No      Plan:     I have personally reviewed and noted the following in the patients chart:   Medical and social history Use of alcohol, tobacco or illicit drugs  Current medications and supplements including opioid prescriptions.  Functional ability and status Nutritional status Physical activity Advanced directives List of other physicians Hospitalizations, surgeries, and ER visits in previous 12 months Vitals Screenings to include cognitive, depression, and falls Referrals and appointments  In addition, I have reviewed and discussed with patient certain preventive protocols, quality metrics, and best practice recommendations. A written personalized care plan for preventive services as well as general preventive health recommendations were provided to patient.     Clemetine Marker, LPN   09/06/2023   Nurse Notes: pt accompanied to visit by her daughter Earl Gala

## 2021-09-05 NOTE — Patient Instructions (Signed)
[name] , Thank you for taking time to come for your Medicare Wellness Visit. I appreciate your ongoing commitment to your health goals. Please review the following plan we discussed and let me know if I can assist you in the future.   Screening recommendations/referrals: Colonoscopy: no longer required Mammogram: no longer required Bone Density: no longer required Recommended yearly ophthalmology/optometry visit for glaucoma screening and checkup Recommended yearly dental visit for hygiene and checkup  Vaccinations: Influenza vaccine: declined Pneumococcal vaccine: done 08/13/16 Tdap vaccine: done 03/08/21 Shingles vaccine: Shingrix discussed. Please contact your pharmacy for coverage information.  Covid-19:done 01/02/20; please bring a copy of your vaccine record to your next appt for booster information  Conditions/risks identified: Recommend drinking 6-8 glasses of water per day   Next appointment: Follow up in one year for your annual wellness visit    Preventive Care 65 Years and Older, Female Preventive care refers to lifestyle choices and visits with your health care provider that can promote health and wellness. What does preventive care include? A yearly physical exam. This is also called an annual well check. Dental exams once or twice a year. Routine eye exams. Ask your health care provider how often you should have your eyes checked. Personal lifestyle choices, including: Daily care of your teeth and gums. Regular physical activity. Eating a healthy diet. Avoiding tobacco and drug use. Limiting alcohol use. Practicing safe sex. Taking low-dose aspirin every day. Taking vitamin and mineral supplements as recommended by your health care provider. What happens during an annual well check? The services and screenings done by your health care provider during your annual well check will depend on your age, overall health, lifestyle risk factors, and family history of  disease. Counseling  Your health care provider may ask you questions about your: Alcohol use. Tobacco use. Drug use. Emotional well-being. Home and relationship well-being. Sexual activity. Eating habits. History of falls. Memory and ability to understand (cognition). Work and work Statistician. Reproductive health. Screening  You may have the following tests or measurements: Height, weight, and BMI. Blood pressure. Lipid and cholesterol levels. These may be checked every 5 years, or more frequently if you are over 23 years old. Skin check. Lung cancer screening. You may have this screening every year starting at age 57 if you have a 30-pack-year history of smoking and currently smoke or have quit within the past 15 years. Fecal occult blood test (FOBT) of the stool. You may have this test every year starting at age 26. Flexible sigmoidoscopy or colonoscopy. You may have a sigmoidoscopy every 5 years or a colonoscopy every 10 years starting at age 56. Hepatitis C blood test. Hepatitis B blood test. Sexually transmitted disease (STD) testing. Diabetes screening. This is done by checking your blood sugar (glucose) after you have not eaten for a while (fasting). You may have this done every 1-3 years. Bone density scan. This is done to screen for osteoporosis. You may have this done starting at age 74. Mammogram. This may be done every 1-2 years. Talk to your health care provider about how often you should have regular mammograms. Talk with your health care provider about your test results, treatment options, and if necessary, the need for more tests. Vaccines  Your health care provider may recommend certain vaccines, such as: Influenza vaccine. This is recommended every year. Tetanus, diphtheria, and acellular pertussis (Tdap, Td) vaccine. You may need a Td booster every 10 years. Zoster vaccine. You may need this after age 24.  Pneumococcal 13-valent conjugate (PCV13) vaccine. One  dose is recommended after age 38. Pneumococcal polysaccharide (PPSV23) vaccine. One dose is recommended after age 55. Talk to your health care provider about which screenings and vaccines you need and how often you need them. This information is not intended to replace advice given to you by your health care provider. Make sure you discuss any questions you have with your health care provider. Document Released: 07/20/2015 Document Revised: 03/12/2016 Document Reviewed: 04/24/2015 Elsevier Interactive Patient Education  2017 [employer] Prevention in the Home Falls can cause injuries. They can happen to people of all ages. There are many things you can do to make your home safe and to help prevent falls. What can I do on the outside of my home? Regularly fix the edges of walkways and driveways and fix any cracks. Remove anything that might make you trip as you walk through a door, such as a raised step or threshold. Trim any bushes or trees on the path to your home. Use bright outdoor lighting. Clear any walking paths of anything that might make someone trip, such as rocks or tools. Regularly check to see if handrails are loose or broken. Make sure that both sides of any steps have handrails. Any raised decks and porches should have guardrails on the edges. Have any leaves, snow, or ice cleared regularly. Use sand or salt on walking paths during winter. Clean up any spills in your garage right away. This includes oil or grease spills. What can I do in the bathroom? Use night lights. Install grab bars by the toilet and in the tub and shower. Do not use towel bars as grab bars. Use non-skid mats or decals in the tub or shower. If you need to sit down in the shower, use a plastic, non-slip stool. Keep the floor dry. Clean up any water that spills on the floor as soon as it happens. Remove soap buildup in the tub or shower regularly. Attach bath mats securely with double-sided  non-slip rug tape. Do not have throw rugs and other things on the floor that can make you trip. What can I do in the bedroom? Use night lights. Make sure that you have a light by your bed that is easy to reach. Do not use any sheets or blankets that are too big for your bed. They should not hang down onto the floor. Have a firm chair that has side arms. You can use this for support while you get dressed. Do not have throw rugs and other things on the floor that can make you trip. What can I do in the kitchen? Clean up any spills right away. Avoid walking on wet floors. Keep items that you use a lot in easy-to-reach places. If you need to reach something above you, use a strong step stool that has a grab bar. Keep electrical cords out of the way. Do not use floor polish or wax that makes floors slippery. If you must use wax, use non-skid floor wax. Do not have throw rugs and other things on the floor that can make you trip. What can I do with my stairs? Do not leave any items on the stairs. Make sure that there are handrails on both sides of the stairs and use them. Fix handrails that are broken or loose. Make sure that handrails are as long as the stairways. Check any carpeting to make sure that it is firmly attached to the stairs. Fix any  carpet that is loose or worn. Avoid having throw rugs at the top or bottom of the stairs. If you do have throw rugs, attach them to the floor with carpet tape. Make sure that you have a light switch at the top of the stairs and the bottom of the stairs. If you do not have them, ask someone to add them for you. What else can I do to help prevent falls? Wear shoes that: Do not have high heels. Have rubber bottoms. Are comfortable and fit you well. Are closed at the toe. Do not wear sandals. If you use a stepladder: Make sure that it is fully opened. Do not climb a closed stepladder. Make sure that both sides of the stepladder are locked into place. Ask  someone to hold it for you, if possible. Clearly mark and make sure that you can see: Any grab bars or handrails. First and last steps. Where the edge of each step is. Use tools that help you move around (mobility aids) if they are needed. These include: Canes. Walkers. Scooters. Crutches. Turn on the lights when you go into a dark area. Replace any light bulbs as soon as they burn out. Set up your furniture so you have a clear path. Avoid moving your furniture around. If any of your floors are uneven, fix them. If there are any pets around you, be aware of where they are. Review your medicines with your doctor. Some medicines can make you feel dizzy. This can increase your chance of falling. Ask your doctor what other things that you can do to help prevent falls. This information is not intended to replace advice given to you by your health care provider. Make sure you discuss any questions you have with your health care provider. Document Released: 04/19/2009 Document Revised: 11/29/2015 Document Reviewed: 07/28/2014 Elsevier Interactive Patient Education  2017 [employer].

## 2021-11-22 ENCOUNTER — Encounter: Payer: Self-pay | Admitting: Family Medicine

## 2021-11-22 ENCOUNTER — Ambulatory Visit: Payer: Medicare HMO | Admitting: Nurse Practitioner

## 2021-11-22 ENCOUNTER — Ambulatory Visit (INDEPENDENT_AMBULATORY_CARE_PROVIDER_SITE_OTHER): Payer: Medicare HMO | Admitting: Family Medicine

## 2021-11-22 ENCOUNTER — Ambulatory Visit: Payer: Medicare HMO | Admitting: Family Medicine

## 2021-11-22 VITALS — BP 126/78 | HR 94 | Temp 98.2°F | Resp 16 | Ht 60.0 in | Wt 183.0 lb

## 2021-11-22 DIAGNOSIS — W5501XA Bitten by cat, initial encounter: Secondary | ICD-10-CM

## 2021-11-22 DIAGNOSIS — J432 Centrilobular emphysema: Secondary | ICD-10-CM | POA: Diagnosis not present

## 2021-11-22 DIAGNOSIS — E538 Deficiency of other specified B group vitamins: Secondary | ICD-10-CM | POA: Diagnosis not present

## 2021-11-22 DIAGNOSIS — L02512 Cutaneous abscess of left hand: Secondary | ICD-10-CM

## 2021-11-22 DIAGNOSIS — D692 Other nonthrombocytopenic purpura: Secondary | ICD-10-CM

## 2021-11-22 DIAGNOSIS — E559 Vitamin D deficiency, unspecified: Secondary | ICD-10-CM

## 2021-11-22 DIAGNOSIS — I700 Atherosclerosis of aorta: Secondary | ICD-10-CM | POA: Diagnosis not present

## 2021-11-22 DIAGNOSIS — R7989 Other specified abnormal findings of blood chemistry: Secondary | ICD-10-CM

## 2021-11-22 MED ORDER — AMOXICILLIN-POT CLAVULANATE 875-125 MG PO TABS: 1.0000 | ORAL_TABLET | Freq: Two times a day (BID) | ORAL | 0 refills | Status: DC

## 2021-11-22 NOTE — Progress Notes (Signed)
Name: Heather Norris   MRN: [id number]    DOB: 1946-04-13   Date:11/22/2021       Progress Note  Subjective  Chief Complaint  Acute for cat bite  HPI  Cat bite: she was at home and her roommate's cat was on the bed, she used the back of her left hand to shoo the cat away and his tooth stabbed her on dorsal left hand 5 days ago. She has been keeping area clean, she noticed some redness and a pus pocket and stuck a needle to drain it but the area is not going down. No fever, chills and redness around the area is stable in size  Emphysema: she denies cough or SOB, she smoked for 50 plus years, not interested in have CT chest . She smokes up to two packs daily but quit about 7 years ago. Does not want medications  Atherosclerosis Aorta: she continues to  refuse medications  Osteopenia with high FRAX risk but refuses therapy  Senile purpura: very frail skin and senile purpura on arms, stable and reassurance given   Skin lesion on left temporal area: going on for over 18  months, she uses topical medication without help, discussed need for biopsy but she refuses it , explained it may be skin cancer. Offered to give he a cream to see if would help, but she also refused that   OA knee: stable, uses a cane prn, no effusion  B12/peripheral neuropathy : she has lower extremity paresthesia but improved with supplementation   Patient Active Problem List   Diagnosis Date Noted   Centrilobular emphysema ([place]) 11/22/2021   Low vitamin B12 level 11/22/2021   Atherosclerosis of aorta ([place]) 01/27/2019   Senile purpura ([place]) 01/27/2019   Osteopenia 09/25/2016   GERD (gastroesophageal reflux disease) 08/13/2016   Tinnitus of left ear 08/13/2016   History of bariatric surgery 08/13/2016   Vitamin D deficiency 08/13/2016   History of shoulder fracture 08/13/2016   Osteoarthritis of both knees 08/13/2016   Insulin resistance 08/13/2016    Past Surgical History:  Procedure Laterality Date    CATARACT EXTRACTION W/PHACO Left 01/04/2020   Procedure: CATARACT EXTRACTION PHACO AND INTRAOCULAR LENS PLACEMENT (IOC) LEFT 6.43 00:56.9 11.3%;  Surgeon: Leandrew Koyanagi, MD;  Location: [facility];  Service: Ophthalmology;  Laterality: Left;   CATARACT EXTRACTION W/PHACO Right 01/25/2020   Procedure: CATARACT EXTRACTION PHACO AND INTRAOCULAR LENS PLACEMENT (IOC) RIGHT 7.47 01:00.0 12.5%;  Surgeon: Leandrew Koyanagi, MD;  Location: [facility];  Service: Ophthalmology;  Laterality: Right;   CHOLECYSTECTOMY     COLONOSCOPY  06/25/2011   GASTRIC BYPASS  1978    Family History  Adopted: Yes  Problem Relation Age of Onset   Mental illness Daughter        anxiety     Social History   Tobacco Use   Smoking status: Former    Packs/day: 2.00    Years: 30.00    Pack years: 60.00    Types: Cigarettes    Start date: 07/07/1956    Quit date: 12/06/2011    Years since quitting: 9.9   Smokeless tobacco: Never  Substance Use Topics   Alcohol use: No     Current Outpatient Medications:    Acetaminophen (ARTHRITIS PAIN RELIEF PO), Take 650 mg by mouth as needed., Disp: , Rfl:    amoxicillin-clavulanate (AUGMENTIN) 875-125 MG tablet, Take 1 tablet by mouth 2 (two) times daily., Disp: 14 tablet, Rfl: 0   APPLE CIDER VINEGAR  PO, Take 1 tablet by mouth daily., Disp: , Rfl:    Calcium-Magnesium-Zinc 333-133-5 MG TABS, Take 3 tablets by mouth daily., Disp: , Rfl:    esomeprazole (NEXIUM) 20 MG capsule, Take 20 mg by mouth at bedtime. OTC, Disp: , Rfl:    Magnesium 500 MG TABS, Take by mouth daily., Disp: , Rfl:    Turmeric 500 MG CAPS, Take by mouth., Disp: , Rfl:    vitamin B-12 (CYANOCOBALAMIN) 500 MCG tablet, Take 1,000 mcg by mouth daily. , Disp: , Rfl:    vitamin C (ASCORBIC ACID) 250 MG tablet, Take 500 mg by mouth daily., Disp: , Rfl:    Vitamin D, Cholecalciferol, 10 MCG (400 UNIT) TABS, Take 2 tablets by mouth daily., Disp: , Rfl:   No Known Allergies  I personally  reviewed active problem list, medication list, allergies, family history, social history with the patient/caregiver today.   ROS  Constitutional: Negative for fever or weight change.  Respiratory: Negative for cough and shortness of breath.   Cardiovascular: Negative for chest pain or palpitations.  Gastrointestinal: Negative for abdominal pain, no bowel changes.  Musculoskeletal: Positive for gait problem and  joint swelling.  Skin: Positive  for rash.  Neurological: Negative for dizziness or headache.  No other specific complaints in a complete review of systems (except as listed in HPI above).   Objective  Vitals:   11/22/21 1552  BP: 126/78  Pulse: 94  Resp: 16  Temp: 98.2 F (36.8 C)  TempSrc: Oral  SpO2: 99%  Weight: 183 lb (83 kg)  Height: 5' (1.524 m)    Body mass index is 35.74 kg/m.  Physical Exam  Constitutional: Patient appears well-developed and well-nourished. Obese  No distress.  HEENT: head atraumatic, normocephalic, pupils equal and reactive to light,, neck supple Cardiovascular: Normal rate, regular rhythm and normal heart sounds.  No murmur heard. No BLE edema. Pulmonary/Chest: Effort normal and breath sounds normal. No respiratory distress. Abdominal: Soft.  There is no tenderness. Psychiatric: Patient has a normal mood and affect. behavior is normal. Judgment and thought content normal.  Hand: see attached picture, area soft to touch with a pus pocket  Skin: round lesion on left temporal area and also above left eye brow, dry, violaceous border  PHQ2/9:    11/22/2021    3:54 PM 09/05/2021    1:38 PM 03/08/2021    1:21 PM 09/04/2020    1:51 PM 03/08/2020   11:29 AM  Depression screen PHQ 2/9  Decreased Interest 0 0 2 0 0  Down, Depressed, Hopeless 0 0 0 0 0  PHQ - 2 Score 0 0 2 0 0  Altered sleeping 0 0 0    Tired, decreased energy 0 0 0    Change in appetite 0 0 0    Feeling bad or failure about yourself  0 0 0    Trouble concentrating 0 0 0     Moving slowly or fidgety/restless 0 0 0    Suicidal thoughts 0 0 0    PHQ-9 Score 0 0 2    Difficult doing work/chores  Not difficult at all Not difficult at all      phq 9 is negative   Fall Risk:    11/22/2021    3:53 PM 09/05/2021    1:40 PM 03/08/2021    1:16 PM 09/04/2020    1:53 PM 03/08/2020   11:28 AM  Fall Risk   Falls in the past year? 0 0 0 0  0  Number falls in past yr:  0 0 0 0  Injury with Fall?  0  0 0  Risk for fall due to : No Fall Risks No Fall Risks  No Fall Risks   Follow up Falls prevention discussed Falls prevention discussed  Falls prevention discussed      Functional Status Survey: Is the patient deaf or have difficulty hearing?: No Does the patient have difficulty seeing, even when wearing glasses/contacts?: No Does the patient have difficulty concentrating, remembering, or making decisions?: No Does the patient have difficulty walking or climbing stairs?: No Does the patient have difficulty dressing or bathing?: No Does the patient have difficulty doing errands alone such as visiting a doctor's office or shopping?: No    Assessment & Plan  1. Cat bite, initial encounter  - amoxicillin-clavulanate (AUGMENTIN) 875-125 MG tablet; Take 1 tablet by mouth 2 (two) times daily.  Dispense: 14 tablet; Refill: 0  2. Abscess of left hand  - amoxicillin-clavulanate (AUGMENTIN) 875-125 MG tablet; Take 1 tablet by mouth 2 (two) times daily.  Dispense: 14 tablet; Refill: 0 - WOUND CULTURE  3. Centrilobular emphysema ([place])   4. Senile purpura ([place])   5. Atherosclerosis of aorta ([place])   6. Low vitamin B12 level   7. Vitamin D deficiency

## 2021-11-25 ENCOUNTER — Telehealth: Payer: Self-pay

## 2021-11-25 LAB — WOUND CULTURE
MICRO NUMBER:: 13423500
SPECIMEN QUALITY:: ADEQUATE

## 2021-11-25 NOTE — Telephone Encounter (Signed)
Dr. Ancil Boozer has not reviewed them yet. Will follow up with patient once she has done so and advises.

## 2021-11-25 NOTE — Telephone Encounter (Unsigned)
Copied from [place] [id number]. Topic: General - Other >> Nov 25, 2021  3:10 PM Yvette Rack wrote: Reason for CRM: Pt called for lab results. Pt requests call back. Cb# [phone]

## 2022-03-07 NOTE — Progress Notes (Unsigned)
Name: Heather Norris   MRN: [id number]    DOB: Aug 04, 1945   Date:03/11/2022       Progress Note  Subjective  Chief Complaint  Follow Up  HPI  GERD: she takes otc Nexium and controls her heartburn , she denies dysphagia or indigestion. At most every other week.   DDD cervical spine: with spinal stenosis patient denies neck pain unless looking down to work on a puzzle. Denies tingling, numbness or weakness on her arms, C-spine MRI done at [facility] 03/2019 , unchanged   Gait difficulty: she has OA of knees , she states pain is mild and constant, she has a cane but only uses as needed   Emphysema: she denies cough or SOB, she smoked for 50 plus years . She smokes up to two packs daily but quit about 8 years ago. Does not want medications. She had a CT chest done in 2019 but does not want any more studies   Atherosclerosis Aorta: she continues to  refuse medications, unchanged   Osteopenia with high FRAX risk but refuses therapy, also not interested in having it rechecked   Senile purpura: very frail skin and senile purpura on arms, unchanged   Skin lesion on left temporal area: going on for over 18  months, she uses topical medication without help, discussed need for biopsy but she refuses it , explained it may be skin cancer. She continues to refuse topical medication   B12/peripheral neuropathy : she has lower extremity paresthesia but improved with supplementation , still numb   Morbid obesity: BMI above 35 with co-morbidities such as GERD, OA and atherosclerosis of aorta. She had bariatric surgery many years ago trying to cut down on sweets   Patient Active Problem List   Diagnosis Date Noted  . Centrilobular emphysema ([place]) 11/22/2021  . Low vitamin B12 level 11/22/2021  . Atherosclerosis of aorta ([place]) 01/27/2019  . Senile purpura ([place]) 01/27/2019  . Osteopenia 09/25/2016  . GERD (gastroesophageal reflux disease) 08/13/2016  . Tinnitus of left ear 08/13/2016  . History of  bariatric surgery 08/13/2016  . Vitamin D deficiency 08/13/2016  . History of shoulder fracture 08/13/2016  . Osteoarthritis of both knees 08/13/2016  . Insulin resistance 08/13/2016    Past Surgical History:  Procedure Laterality Date  . CATARACT EXTRACTION W/PHACO Left 01/04/2020   Procedure: CATARACT EXTRACTION PHACO AND INTRAOCULAR LENS PLACEMENT (IOC) LEFT 6.43 00:56.9 11.3%;  Surgeon: Leandrew Koyanagi, MD;  Location: [facility];  Service: Ophthalmology;  Laterality: Left;  . CATARACT EXTRACTION W/PHACO Right 01/25/2020   Procedure: CATARACT EXTRACTION PHACO AND INTRAOCULAR LENS PLACEMENT (IOC) RIGHT 7.47 01:00.0 12.5%;  Surgeon: Leandrew Koyanagi, MD;  Location: [facility];  Service: Ophthalmology;  Laterality: Right;  . CHOLECYSTECTOMY    . COLONOSCOPY  06/25/2011  . GASTRIC BYPASS  1978    Family History  Adopted: Yes  Problem Relation Age of Onset  . Mental illness Daughter        anxiety     Social History   Tobacco Use  . Smoking status: Former    Packs/day: 2.00    Years: 30.00    Total pack years: 60.00    Types: Cigarettes    Start date: 07/07/1956    Quit date: 12/06/2011    Years since quitting: 10.2  . Smokeless tobacco: Never  Substance Use Topics  . Alcohol use: No     Current Outpatient Medications:  .  Calcium-Magnesium-Zinc 333-133-5 MG TABS, Take 3 tablets by mouth  daily., Disp: , Rfl:  .  vitamin B-12 (CYANOCOBALAMIN) 500 MCG tablet, Take 1,000 mcg by mouth daily. , Disp: , Rfl:  .  vitamin C (ASCORBIC ACID) 250 MG tablet, Take 500 mg by mouth daily., Disp: , Rfl:   No Known Allergies  I personally reviewed active problem list, medication list, allergies, family history, social history, health maintenance with the patient/caregiver today.   ROS  Constitutional: Negative for fever or weight change.  Respiratory: Negative for cough and shortness of breath.   Cardiovascular: Negative for chest pain or palpitations.   Gastrointestinal: Negative for abdominal pain, no bowel changes.  Musculoskeletal: positive for gait problem but no  joint swelling.  Skin: Negative for rash.  Neurological: Negative for dizziness or headache.  No other specific complaints in a complete review of systems (except as listed in HPI above).   Objective  Vitals:   03/11/22 1142  BP: 132/72  Pulse: 86  Resp: 16  SpO2: 97%  Weight: 180 lb (81.6 kg)  Height: 5' (1.524 m)    Body mass index is 35.15 kg/m.  Physical Exam  Constitutional: Patient appears well-developed and well-nourished. Obese  No distress.  HEENT: head atraumatic, normocephalic, pupils equal and reactive to light, neck supple Cardiovascular: Normal rate, regular rhythm and normal heart sounds.  No murmur heard. No BLE edema. Pulmonary/Chest: Effort normal and breath sounds normal. No respiratory distress. Abdominal: Soft.  There is no tenderness. Psychiatric: Patient has a normal mood and affect. behavior is normal. Judgment and thought content normal.    PHQ2/9:    03/11/2022   11:41 AM 11/22/2021    3:54 PM 09/05/2021    1:38 PM 03/08/2021    1:21 PM 09/04/2020    1:51 PM  Depression screen PHQ 2/9  Decreased Interest 0 0 0 2 0  Down, Depressed, Hopeless 0 0 0 0 0  PHQ - 2 Score 0 0 0 2 0  Altered sleeping 0 0 0 0   Tired, decreased energy 0 0 0 0   Change in appetite 0 0 0 0   Feeling bad or failure about yourself  0 0 0 0   Trouble concentrating 0 0 0 0   Moving slowly or fidgety/restless 0 0 0 0   Suicidal thoughts 0 0 0 0   PHQ-9 Score 0 0 0 2   Difficult doing work/chores   Not difficult at all Not difficult at all     phq 9 is negative   Fall Risk:    03/11/2022   11:41 AM 11/22/2021    3:53 PM 09/05/2021    1:40 PM 03/08/2021    1:16 PM 09/04/2020    1:53 PM  Fall Risk   Falls in the past year? 0 0 0 0 0  Number falls in past yr: 0  0 0 0  Injury with Fall? 0  0  0  Risk for fall due to : No Fall Risks No Fall Risks No Fall Risks   No Fall Risks  Follow up Falls prevention discussed Falls prevention discussed Falls prevention discussed  Falls prevention discussed      Functional Status Survey: Is the patient deaf or have difficulty hearing?: No Does the patient have difficulty seeing, even when wearing glasses/contacts?: No Does the patient have difficulty concentrating, remembering, or making decisions?: No Does the patient have difficulty walking or climbing stairs?: Yes Does the patient have difficulty dressing or bathing?: No Does the patient have difficulty doing errands alone  such as visiting a doctor's office or shopping?: No    Assessment & Plan  1. Senile purpura ([facility])  - COMPLETE METABOLIC PANEL WITH GFR  2. Atherosclerosis of aorta ([facility])  - COMPLETE METABOLIC PANEL WITH GFR  3. Centrilobular emphysema ([facility])  Stable   4. Low vitamin B12 level  - B12 and Folate Panel  5. Morbid obesity ([place])  Discussed with the patient the risk posed by an increased BMI. Discussed importance of portion control, calorie counting and at least 150 minutes of physical activity weekly. Avoid sweet beverages and drink more water. Eat at least 6 servings of fruit and vegetables daily    6. Vitamin D deficiency   7. Cervical spinal stenosis   8. Primary osteoarthritis of both knees   9. Idiopathic peripheral neuropathy  - COMPLETE METABOLIC PANEL WITH GFR   Discussed COVID booster, RSV vaccine and high dose flu but she refused all of them

## 2022-03-11 ENCOUNTER — Ambulatory Visit (INDEPENDENT_AMBULATORY_CARE_PROVIDER_SITE_OTHER): Payer: Medicare HMO | Admitting: Family Medicine

## 2022-03-11 ENCOUNTER — Encounter: Payer: Self-pay | Admitting: Family Medicine

## 2022-03-11 VITALS — BP 132/72 | HR 86 | Resp 16 | Ht 60.0 in | Wt 180.0 lb

## 2022-03-11 DIAGNOSIS — G609 Hereditary and idiopathic neuropathy, unspecified: Secondary | ICD-10-CM | POA: Diagnosis not present

## 2022-03-11 DIAGNOSIS — J432 Centrilobular emphysema: Secondary | ICD-10-CM | POA: Diagnosis not present

## 2022-03-11 DIAGNOSIS — M170 Bilateral primary osteoarthritis of knee: Secondary | ICD-10-CM

## 2022-03-11 DIAGNOSIS — Z1231 Encounter for screening mammogram for malignant neoplasm of breast: Secondary | ICD-10-CM

## 2022-03-11 DIAGNOSIS — E559 Vitamin D deficiency, unspecified: Secondary | ICD-10-CM

## 2022-03-11 DIAGNOSIS — E538 Deficiency of other specified B group vitamins: Secondary | ICD-10-CM

## 2022-03-11 DIAGNOSIS — M4802 Spinal stenosis, cervical region: Secondary | ICD-10-CM

## 2022-03-11 DIAGNOSIS — D692 Other nonthrombocytopenic purpura: Secondary | ICD-10-CM | POA: Diagnosis not present

## 2022-03-11 DIAGNOSIS — I700 Atherosclerosis of aorta: Secondary | ICD-10-CM | POA: Diagnosis not present

## 2022-03-12 LAB — COMPLETE METABOLIC PANEL WITH GFR
AG Ratio: 1.5 (calc) (ref 1.0–2.5)
ALT: 9 U/L (ref 6–29)
AST: 17 U/L (ref 10–35)
Albumin: 4 g/dL (ref 3.6–5.1)
Alkaline phosphatase (APISO): 56 U/L (ref 37–153)
BUN: 14 mg/dL (ref 7–25)
CO2: 21 mmol/L (ref 20–32)
Calcium: 9.1 mg/dL (ref 8.6–10.4)
Chloride: 108 mmol/L (ref 98–110)
Creat: 0.83 mg/dL (ref 0.60–1.00)
Globulin: 2.6 g/dL (calc) (ref 1.9–3.7)
Glucose, Bld: 126 mg/dL — ABNORMAL HIGH (ref 65–99)
Potassium: 4.2 mmol/L (ref 3.5–5.3)
Sodium: 142 mmol/L (ref 135–146)
Total Bilirubin: 0.5 mg/dL (ref 0.2–1.2)
Total Protein: 6.6 g/dL (ref 6.1–8.1)
eGFR: 72 mL/min/{1.73_m2} (ref >=60)

## 2022-03-12 LAB — B12 AND FOLATE PANEL
Folate: 12 ng/mL
Vitamin B-12: 279 pg/mL (ref 200–1100)

## 2022-08-28 DIAGNOSIS — H4922 Sixth [abducent] nerve palsy, left eye: Secondary | ICD-10-CM | POA: Diagnosis not present

## 2022-08-28 DIAGNOSIS — D3131 Benign neoplasm of right choroid: Secondary | ICD-10-CM | POA: Diagnosis not present

## 2022-08-28 DIAGNOSIS — H43813 Vitreous degeneration, bilateral: Secondary | ICD-10-CM | POA: Diagnosis not present

## 2022-08-28 DIAGNOSIS — Z961 Presence of intraocular lens: Secondary | ICD-10-CM | POA: Diagnosis not present

## 2022-09-09 ENCOUNTER — Ambulatory Visit: Payer: Medicare Other

## 2022-09-11 ENCOUNTER — Ambulatory Visit (INDEPENDENT_AMBULATORY_CARE_PROVIDER_SITE_OTHER): Payer: 59

## 2022-09-11 VITALS — BP 122/80 | Ht 60.0 in | Wt 180.4 lb

## 2022-09-11 DIAGNOSIS — Z0000 Encounter for general adult medical examination without abnormal findings: Secondary | ICD-10-CM | POA: Diagnosis not present

## 2022-09-11 NOTE — Progress Notes (Signed)
Subjective:   Heather Norris is a 77 y.o. female who presents for Medicare Annual (Subsequent) preventive examination.  Review of Systems    Cardiac Risk Factors include: advanced age (>86mn, >>7women);obesity (BMI >30kg/m2);sedentary lifestyle     Objective:    Today's Vitals   09/11/22 1512  BP: 122/80  Weight: 180 lb 6.4 oz (81.8 kg)  Height: 5' (1.524 m)   Body mass index is 35.23 kg/m.     09/11/2022    3:24 PM 09/05/2021    1:40 PM 09/04/2020    1:53 PM 01/25/2020    8:09 AM 01/04/2020    9:17 AM 08/17/2018   10:52 AM 08/11/2017    3:18 PM  Advanced Directives  Does Patient Have a Medical Advance Directive? Yes Yes Yes Yes Yes No No  Type of ACorporate treasure[place]f [place] will [place]   Does patient want to make changes to medical advance directive?    No - Patient declined No - Patient declined    Copy of [place] Chart?  Yes - validated most recent copy scanned in chart (See row information) Yes - validated most recent copy scanned in chart (See row information) Yes - validated most recent copy scanned in chart (See row information) No - copy requested    Would patient like information on creating a medical advance directive?      Yes (MAU/Ambulatory/Procedural Areas - Information given) Yes (MAU/Ambulatory/Procedural Areas - Information given)    Current Medications (verified) Outpatient Encounter Medications as of 09/11/2022  Medication Sig   Calcium-Magnesium-Zinc 333-133-5 MG TABS Take 3 tablets by mouth daily.   esomeprazole (NEXIUM) 20 MG capsule Take 20 mg by mouth daily at 12 noon.   vitamin B-12 (CYANOCOBALAMIN) 500 MCG tablet Take 1,000 mcg by mouth daily.    vitamin C (ASCORBIC ACID) 250 MG tablet Take 500 mg by mouth daily.   No facility-administered encounter medications on file as of 09/11/2022.     Allergies (verified) Patient has no known allergies.   History: Past Medical History:  Diagnosis Date   Arthritis    knees   GERD (gastroesophageal reflux disease)    Osteopenia    Tinnitus of left ear    Past Surgical History:  Procedure Laterality Date   CATARACT EXTRACTION W/PHACO Left 01/04/2020   Procedure: CATARACT EXTRACTION PHACO AND INTRAOCULAR LENS PLACEMENT (IOC) LEFT 6.43 00:56.9 11.3%;  Surgeon: BLeandrew Koyanagi MD;  [facility]  Service: Ophthalmology;  Laterality: Left;   CATARACT EXTRACTION W/PHACO Right 01/25/2020   Procedure: CATARACT EXTRACTION PHACO AND INTRAOCULAR LENS PLACEMENT (IOC) RIGHT 7.47 01:00.0 12.5%;  Surgeon: BLeandrew Koyanagi MD;  [facility]  Service: Ophthalmology;  Laterality: Right;   CHOLECYSTECTOMY     COLONOSCOPY  06/25/2011   GASTRIC BYPASS  1978   Family History  Adopted: Yes  Problem Relation Age of Onset   Mental illness Daughter        anxiety    Social History   Socioeconomic History   Marital status: Widowed    Spouse name: GIona Beard  Number of children: 1   Years of education: Not on file   Highest education level: 7th grade  Occupational History   Occupation: Retired  Tobacco Use   Smoking status: Former    Packs/day: 2.00    Years: 30.00    Total pack years: 60.00  Types: Cigarettes    Start date: 07/07/1956    Quit date: 12/06/2011    Years since quitting: 10.7   Smokeless tobacco: Never  Vaping Use   Vaping Use: Never used  Substance and Sexual Activity   Alcohol use: No   Drug use: No   Sexual activity: Not Currently  Other Topics Concern   Not on file  Social History Narrative   She has been a widow since 1989   She has not dated since.    She has one child, [name].   Pt lives with a roommate.    Social Determinants of Health   Financial Resource Strain: Low Risk  (09/11/2022)   Overall Financial Resource Strain (CARDIA)    Difficulty of Paying Living  Expenses: Not hard at all  Food Insecurity: No Food Insecurity (09/11/2022)   Hunger Vital Sign    Worried About Running Out of Food in the Last Year: Never true    Ran Out of Food in the Last Year: Never true  Transportation Needs: No Transportation Needs (09/11/2022)   PRAPARE - Hydrologist (Medical): No    Lack of Transportation (Non-Medical): No  Physical Activity: Inactive (09/11/2022)   Exercise Vital Sign    Days of Exercise per Week: 0 days    Minutes of Exercise per Session: 0 min  Stress: No Stress Concern Present (09/11/2022)   [place]    Feeling of Stress : Not at all  Social Connections: Socially Isolated (09/11/2022)   Social Connection and Isolation Panel [NHANES]    Frequency of Communication with Friends and Family: More than three times a week    Frequency of Social Gatherings with Friends and Family: Never    Attends Religious Services: Never    Marine scientist or Organizations: No    Attends Archivist Meetings: Never    Marital Status: Widowed    Tobacco Counseling Counseling given: Not Answered   Clinical Intake:  Pre-visit preparation completed: Yes  Pain : No/denies pain     BMI - recorded: 35.23 Nutritional Status: BMI > 30  Obese Nutritional Risks: None Diabetes: No  How often do you need to have someone help you when you read instructions, pamphlets, or other written materials from your doctor or pharmacy?: 1 - Never  Diabetic?no  Interpreter Needed?: No  Information entered by :: B.Ayelen Sciortino,LPN   Activities of Daily Living    09/11/2022    3:25 PM 03/11/2022   11:42 AM  In your present state of health, do you have any difficulty performing the following activities:  Hearing? 0 0  Vision? 0 0  Difficulty concentrating or making decisions? 0 0  Walking or climbing stairs? 0 1  Dressing or bathing? 0 0  Doing errands, shopping? 0  0  Preparing Food and eating ? N   Using the Toilet? N   In the past six months, have you accidently leaked urine? N   Do you have problems with loss of bowel control? N   Managing your Medications? N   Managing your Finances? N   Housekeeping or managing your Housekeeping? N     Patient Care Team: Steele Sizer, MD as PCP - General (Family Medicine)  Indicate any recent Medical Services you may have received from other than [facility] providers in the past year (date may be approximate).     Assessment:   This is a routine wellness  examination for [place].  Hearing/Vision screen Hearing Screening - Comments:: Adequate hearing Vision Screening - Comments:: Adequate vision w/glasses Cataract surgery 2023 [facility]  Dietary issues and exercise activities discussed: Exercise limited by: None identified   Goals Addressed             This Visit's Progress    DIET - INCREASE WATER INTAKE   Not on track    Recommend to drink at least 6-8 8oz glasses of water per day.       Depression Screen    09/11/2022    3:18 PM 03/11/2022   11:41 AM 11/22/2021    3:54 PM 09/05/2021    1:38 PM 03/08/2021    1:21 PM 09/04/2020    1:51 PM 03/08/2020   11:29 AM  PHQ 2/9 Scores  PHQ - 2 Score 0 0 0 0 2 0 0  PHQ- 9 Score  0 0 0 2      Fall Risk    09/11/2022    3:16 PM 03/11/2022   11:41 AM 11/22/2021    3:53 PM 09/05/2021    1:40 PM 03/08/2021    1:16 PM  Fall Risk   Falls in the past year? 0 0 0 0 0  Number falls in past yr: 0 0  0 0  Injury with Fall? 0 0  0   Risk for fall due to : No Fall Risks No Fall Risks No Fall Risks No Fall Risks   Follow up Education provided;Falls prevention discussed Falls prevention discussed Falls prevention discussed Falls prevention discussed     FALL RISK PREVENTION PERTAINING TO THE HOME:  Any stairs in or around the home? No has ramp  If so, are there any without handrails? No  Home free of loose throw rugs in walkways, pet beds, electrical cords, etc?  Yes  Adequate lighting in your home to reduce risk of falls? Yes   ASSISTIVE DEVICES UTILIZED TO PREVENT FALLS:  Life alert? No  Use of a cane, walker or w/c? No  Grab bars in the bathroom? No  Shower chair or bench in shower? Yes  Elevated toilet seat or a handicapped toilet? Yes   TIMED UP AND GO:  Was the test performed? Yes .  Length of time to ambulate 10 feet: 10 sec.   Gait steady and fast without use of assistive device  Cognitive Function:        09/11/2022    3:26 PM 09/04/2020    1:55 PM 09/01/2019    1:36 PM 08/17/2018   10:59 AM 08/11/2017    3:24 PM  6CIT Screen  What Year? 0 points 0 points 0 points 0 points 0 points  What month? 0 points 0 points 0 points 0 points 0 points  What time? 0 points 0 points 0 points 0 points 0 points  Count back from 20 0 points 0 points 0 points 0 points 0 points  Months in reverse 0 points 0 points 0 points 2 points 2 points  Repeat phrase 2 points 0 points 2 points 0 points 4 points  Total Score 2 points 0 points 2 points 2 points 6 points    Immunizations Immunization History  Administered Date(s) Administered   Influenza,inj,Quad PF,6+ Mos 03/08/2020   Janssen (J&J) SARS-COV-2 Vaccination 01/02/2020   Pneumococcal Conjugate-13 08/13/2016   Pneumococcal Polysaccharide-23 06/11/2011   Td 06/11/2011   Tdap 03/08/2021    TDAP status: Up to date  Flu Vaccine status: Up to date  Pneumococcal vaccine status: Up to date  Covid-19 vaccine status: Completed vaccines  Qualifies for Shingles Vaccine? Yes   Zostavax completed No   Shingrix Completed?: No.    Education has been provided regarding the importance of this vaccine. Patient has been advised to call insurance company to determine out of pocket expense if they have not yet received this vaccine. Advised may also receive vaccine at local pharmacy or Health Dept. Verbalized acceptance and understanding.  Screening Tests Health Maintenance  Topic Date Due   Zoster  Vaccines- Shingrix (1 of 2) Never done   Lung Cancer Screening  09/11/2018   COVID-19 Vaccine (2 - 2023-24 season) 03/07/2022   INFLUENZA VACCINE  10/05/2022 (Originally 02/04/2022)   Medicare Annual Wellness (AWV)  09/11/2023   DTaP/Tdap/Td (3 - Td or Tdap) 03/09/2031   Pneumonia Vaccine 52+ Years old  Completed   DEXA SCAN  Completed   Hepatitis C Screening  Completed   HPV VACCINES  Aged Out   MAMMOGRAM  Discontinued   COLONOSCOPY (Pts 45-39yr Insurance coverage will need to be confirmed)  Discontinued    Health Maintenance  Health Maintenance Due  Topic Date Due   Zoster Vaccines- Shingrix (1 of 2) Never done   Lung Cancer Screening  09/11/2018   COVID-19 Vaccine (2 - 2023-24 season) 03/07/2022    Colorectal cancer screening: No longer required.   Mammogram status: No longer required due to age.  Bone Density status: Completed yes. Results reflect: Bone density results: OSTEOPENIA. Repeat every 5 years.  Lung Cancer Screening: (Low Dose CT Chest recommended if Age 79-80years, 30 pack-year currently smoking OR have quit w/in 15years.) does not qualify.   Lung Cancer Screening Referral: no  Additional Screening:  Hepatitis C Screening: does not qualify; Completed yes  Vision Screening: Recommended annual ophthalmology exams for early detection of glaucoma and other disorders of the eye. Is the patient up to date with their annual eye exam?  Yes  Who is the provider or what is the name of the office in which the patient attends annual eye exams[place] not established with a provider, would they like to be referred to a provider to establish care? Yes .   Dental Screening: Recommended annual dental exams for proper oral hygiene  Community Resource Referral / Chronic Care Management: CRR required this visit?  No   CCM required this visit?  No      Plan:     I have personally reviewed and noted the following in the patient's chart:   Medical and  social history Use of alcohol, tobacco or illicit drugs  Current medications and supplements including opioid prescriptions. Patient is not currently taking opioid prescriptions. Functional ability and status Nutritional status Physical activity Advanced directives List of other physicians Hospitalizations, surgeries, and ER visits in previous 12 months Vitals Screenings to include cognitive, depression, and falls Referrals and appointments  In addition, I have reviewed and discussed with patient certain preventive protocols, quality metrics, and best practice recommendations. A written personalized care plan for preventive services as well as general preventive health recommendations were provided to patient.     [facility] LPN   [id number]  Nurse Notes: pt is doing well. She does inquire if her last labwork was normal but assumes n"no news is good news".

## 2022-09-11 NOTE — Patient Instructions (Signed)
Heather Norris , Thank you for taking time to come for your Medicare Wellness Visit. I appreciate your ongoing commitment to your health goals. Please review the following plan we discussed and let me know if I can assist you in the future.   These are the goals we discussed:  Goals      DIET - INCREASE WATER INTAKE     Recommend to drink at least 6-8 8oz glasses of water per day.        This is a list of the screening recommended for you and due dates:  Health Maintenance  Topic Date Due   Zoster (Shingles) Vaccine (1 of 2) Never done   Screening for Lung Cancer  09/11/2018   COVID-19 Vaccine (2 - 2023-24 season) 03/07/2022   Flu Shot  10/05/2022*   Medicare Annual Wellness Visit  09/11/2023   DTaP/Tdap/Td vaccine (3 - Td or Tdap) 03/09/2031   Pneumonia Vaccine  Completed   DEXA scan (bone density measurement)  Completed   Hepatitis C Screening: USPSTF Recommendation to screen - Ages 35-79 yo.  Completed   HPV Vaccine  Aged Out   Mammogram  Discontinued   Colon Cancer Screening  Discontinued  *Topic was postponed. The date shown is not the original due date.    Advanced directives: yes  Conditions/risks identified: none  Next appointment: Follow up in one year for your annual wellness visit 09/17/2023 '@3pm'$  in person   Preventive Care 65 Years and Older, Female Preventive care refers to lifestyle choices and visits with your health care provider that can promote health and wellness. What does preventive care include? A yearly physical exam. This is also called an annual well check. Dental exams once or twice a year. Routine eye exams. Ask your health care provider how often you should have your eyes checked. Personal lifestyle choices, including: Daily care of your teeth and gums. Regular physical activity. Eating a healthy diet. Avoiding tobacco and drug use. Limiting alcohol use. Practicing safe sex. Taking low-dose aspirin every day. Taking vitamin and mineral  supplements as recommended by your health care provider. What happens during an annual well check? The services and screenings done by your health care provider during your annual well check will depend on your age, overall health, lifestyle risk factors, and family history of disease. Counseling  Your health care provider may ask you questions about your: Alcohol use. Tobacco use. Drug use. Emotional well-being. Home and relationship well-being. Sexual activity. Eating habits. History of falls. Memory and ability to understand (cognition). Work and work Statistician. Reproductive health. Screening  You may have the following tests or measurements: Height, weight, and BMI. Blood pressure. Lipid and cholesterol levels. These may be checked every 5 years, or more frequently if you are over 34 years old. Skin check. Lung cancer screening. You may have this screening every year starting at age 60 if you have a 30-pack-year history of smoking and currently smoke or have quit within the past 15 years. Fecal occult blood test (FOBT) of the stool. You may have this test every year starting at age 69. Flexible sigmoidoscopy or colonoscopy. You may have a sigmoidoscopy every 5 years or a colonoscopy every 10 years starting at age 48. Hepatitis C blood test. Hepatitis B blood test. Sexually transmitted disease (STD) testing. Diabetes screening. This is done by checking your blood sugar (glucose) after you have not eaten for a while (fasting). You may have this done every 1-3 years. Bone density scan. This is  done to screen for osteoporosis. You may have this done starting at age 42. Mammogram. This may be done every 1-2 years. Talk to your health care provider about how often you should have regular mammograms. Talk with your health care provider about your test results, treatment options, and if necessary, the need for more tests. Vaccines  Your health care provider may recommend certain  vaccines, such as: Influenza vaccine. This is recommended every year. Tetanus, diphtheria, and acellular pertussis (Tdap, Td) vaccine. You may need a Td booster every 10 years. Zoster vaccine. You may need this after age 18. Pneumococcal 13-valent conjugate (PCV13) vaccine. One dose is recommended after age 6. Pneumococcal polysaccharide (PPSV23) vaccine. One dose is recommended after age 34. Talk to your health care provider about which screenings and vaccines you need and how often you need them. This information is not intended to replace advice given to you by your health care provider. Make sure you discuss any questions you have with your health care provider. Document Released: 07/20/2015 Document Revised: 03/12/2016 Document Reviewed: 04/24/2015 Elsevier Interactive Patient Education  2017 [place] Prevention in the Home Falls can cause injuries. They can happen to people of all ages. There are many things you can do to make your home safe and to help prevent falls. What can I do on the outside of my home? Regularly fix the edges of walkways and driveways and fix any cracks. Remove anything that might make you trip as you walk through a door, such as a raised step or threshold. Trim any bushes or trees on the path to your home. Use bright outdoor lighting. Clear any walking paths of anything that might make someone trip, such as rocks or tools. Regularly check to see if handrails are loose or broken. Make sure that both sides of any steps have handrails. Any raised decks and porches should have guardrails on the edges. Have any leaves, snow, or ice cleared regularly. Use sand or salt on walking paths during winter. Clean up any spills in your garage right away. This includes oil or grease spills. What can I do in the bathroom? Use night lights. Install grab bars by the toilet and in the tub and shower. Do not use towel bars as grab bars. Use non-skid mats or decals in  the tub or shower. If you need to sit down in the shower, use a plastic, non-slip stool. Keep the floor dry. Clean up any water that spills on the floor as soon as it happens. Remove soap buildup in the tub or shower regularly. Attach bath mats securely with double-sided non-slip rug tape. Do not have throw rugs and other things on the floor that can make you trip. What can I do in the bedroom? Use night lights. Make sure that you have a light by your bed that is easy to reach. Do not use any sheets or blankets that are too big for your bed. They should not hang down onto the floor. Have a firm chair that has side arms. You can use this for support while you get dressed. Do not have throw rugs and other things on the floor that can make you trip. What can I do in the kitchen? Clean up any spills right away. Avoid walking on wet floors. Keep items that you use a lot in easy-to-reach places. If you need to reach something above you, use a strong step stool that has a grab bar. Keep electrical cords out of  the way. Do not use floor polish or wax that makes floors slippery. If you must use wax, use non-skid floor wax. Do not have throw rugs and other things on the floor that can make you trip. What can I do with my stairs? Do not leave any items on the stairs. Make sure that there are handrails on both sides of the stairs and use them. Fix handrails that are broken or loose. Make sure that handrails are as long as the stairways. Check any carpeting to make sure that it is firmly attached to the stairs. Fix any carpet that is loose or worn. Avoid having throw rugs at the top or bottom of the stairs. If you do have throw rugs, attach them to the floor with carpet tape. Make sure that you have a light switch at the top of the stairs and the bottom of the stairs. If you do not have them, ask someone to add them for you. What else can I do to help prevent falls? Wear shoes that: Do not have high  heels. Have rubber bottoms. Are comfortable and fit you well. Are closed at the toe. Do not wear sandals. If you use a stepladder: Make sure that it is fully opened. Do not climb a closed stepladder. Make sure that both sides of the stepladder are locked into place. Ask someone to hold it for you, if possible. Clearly mark and make sure that you can see: Any grab bars or handrails. First and last steps. Where the edge of each step is. Use tools that help you move around (mobility aids) if they are needed. These include: Canes. Walkers. Scooters. Crutches. Turn on the lights when you go into a dark area. Replace any light bulbs as soon as they burn out. Set up your furniture so you have a clear path. Avoid moving your furniture around. If any of your floors are uneven, fix them. If there are any pets around you, be aware of where they are. Review your medicines with your doctor. Some medicines can make you feel dizzy. This can increase your chance of falling. Ask your doctor what other things that you can do to help prevent falls. This information is not intended to replace advice given to you by your health care provider. Make sure you discuss any questions you have with your health care provider. Document Released: 04/19/2009 Document Revised: 11/29/2015 Document Reviewed: 07/28/2014 Elsevier Interactive Patient Education  2017 [employer].

## 2023-03-12 NOTE — Progress Notes (Signed)
Name: Heather Norris   MRN: [id number]    DOB: 1946/04/16   Date:03/13/2023       Progress Note  Subjective  Chief Complaint  Follow Up  Patient came in with daughter Carmela Hurt   HPI  GERD: she takes otc Nexium and controls her heartburn , she denies dysphagia or indigestion. Symptoms are occasional and takes medication prn only   DDD cervical spine: with spinal stenosis patient denies neck pain  Denies tingling, numbness or weakness on her arms, C-spine MRI done at [facility] 03/2019 , she states she is doing better now since avoiding looking down. She hold her word puzzle up  Gait difficulty: she has OA of knees , she states pain is mild and constant, symptoms are mild, takes tylenol prn   Emphysema: she denies cough or SOB, she smoked for 50 plus years . She smokes up to two packs daily but quit about 9 years ago . Marland Kitchen She had a CT chest done in 2019 but does not want any more studies   Atherosclerosis Aorta: she continues to  refuse medications, unchanged    Osteopenia with high FRAX risk but refuses therapy, she is willing to have it checked again  Senile purpura: very frail skin and senile purpura on arms, reassurance given   Skin lesion on left temporal area: going on for years  she uses topical medication without help, discussed need for biopsy but she refuses it , daughter states she picks on that spot .Discussed possible skin cancer  B12/peripheral neuropathy : she has lower extremity paresthesia used to be on her feet only and is now up to below her knees, only taking B12 supplements occasionally, we will recheck levels and if normal we will start gabapentin, if low we will need to resume supplements   Obesity: BMI is now below 35 . She had bariatric surgery many years ago trying to cut down on sweets . She takes supplements occasionally. We will recheck labs today   Patient Active Problem List   Diagnosis Date Noted   Centrilobular emphysema (HCC) 11/22/2021   Low vitamin B12  level 11/22/2021   Atherosclerosis of aorta (HCC) 01/27/2019   Senile purpura (HCC) 01/27/2019   Osteopenia 09/25/2016   GERD (gastroesophageal reflux disease) 08/13/2016   Tinnitus of left ear 08/13/2016   History of bariatric surgery 08/13/2016   Vitamin D deficiency 08/13/2016   History of shoulder fracture 08/13/2016   Osteoarthritis of both knees 08/13/2016   Insulin resistance 08/13/2016    Past Surgical History:  Procedure Laterality Date   CATARACT EXTRACTION W/PHACO Left 01/04/2020   Procedure: CATARACT EXTRACTION PHACO AND INTRAOCULAR LENS PLACEMENT (IOC) LEFT 6.43 00:56.9 11.3%;  Surgeon: Lockie Mola, MD;  Location: [facility];  Service: Ophthalmology;  Laterality: Left;   CATARACT EXTRACTION W/PHACO Right 01/25/2020   Procedure: CATARACT EXTRACTION PHACO AND INTRAOCULAR LENS PLACEMENT (IOC) RIGHT 7.47 01:00.0 12.5%;  Surgeon: Lockie Mola, MD;  Location: [facility];  Service: Ophthalmology;  Laterality: Right;   CHOLECYSTECTOMY     COLONOSCOPY  06/25/2011   GASTRIC BYPASS  1978    Family History  Adopted: Yes  Problem Relation Age of Onset   Mental illness Daughter        anxiety     Social History   Tobacco Use   Smoking status: Former    Current packs/day: 0.00    Average packs/day: 2.0 packs/day for 55.4 years (110.8 ttl pk-yrs)    Types: Cigarettes    Start  date: 07/07/1956    Quit date: 12/06/2011    Years since quitting: 11.2   Smokeless tobacco: Never  Substance Use Topics   Alcohol use: No     Current Outpatient Medications:    Calcium-Magnesium-Zinc 333-133-5 MG TABS, Take 3 tablets by mouth daily., Disp: , Rfl:    esomeprazole (NEXIUM) 20 MG capsule, Take 20 mg by mouth daily at 12 noon., Disp: , Rfl:    vitamin B-12 (CYANOCOBALAMIN) 500 MCG tablet, Take 1,000 mcg by mouth daily. , Disp: , Rfl:    vitamin C (ASCORBIC ACID) 250 MG tablet, Take 500 mg by mouth daily., Disp: , Rfl:   No Known Allergies  I  personally reviewed active problem list, medication list, allergies, family history, social history, health maintenance with the patient/caregiver today.   ROS  Ten systems reviewed and is negative except as mentioned in HPI    Objective  Vitals:   03/13/23 1127  BP: 118/76  Pulse: 91  Resp: 16  Temp: (!) 97.5 F (36.4 C)  TempSrc: Oral  SpO2: 99%  Weight: 174 lb 11.2 oz (79.2 kg)  Height: 5' (1.524 m)    Body mass index is 34.12 kg/m.  Physical Exam  Constitutional: Patient appears well-developed and well-nourished. Obese  No distress.  HEENT: head atraumatic, normocephalic, pupils equal and reactive to light, neck supple Cardiovascular: Normal rate, regular rhythm and normal heart sounds.  No murmur heard. No BLE edema. Pulmonary/Chest: Effort normal and breath sounds normal. No respiratory distress. Skin: dry patch over left lateral eyebrow , does not want to see dermatologist or get it biopsied  Abdominal: Soft.  There is no tenderness. Psychiatric: Patient has a normal mood and affect. behavior is normal. Judgment and thought content normal.   PHQ2/9:    03/13/2023   11:27 AM 09/11/2022    3:18 PM 03/11/2022   11:41 AM 11/22/2021    3:54 PM 09/05/2021    1:38 PM  Depression screen PHQ 2/9  Decreased Interest 0 0 0 0 0  Down, Depressed, Hopeless 0 0 0 0 0  PHQ - 2 Score 0 0 0 0 0  Altered sleeping 0  0 0 0  Tired, decreased energy 0  0 0 0  Change in appetite 0  0 0 0  Feeling bad or failure about yourself  0  0 0 0  Trouble concentrating 0  0 0 0  Moving slowly or fidgety/restless 0  0 0 0  Suicidal thoughts 0  0 0 0  PHQ-9 Score 0  0 0 0  Difficult doing work/chores     Not difficult at all    phq 9 is negative   Fall Risk:    03/13/2023   11:27 AM 09/11/2022    3:16 PM 03/11/2022   11:41 AM 11/22/2021    3:53 PM 09/05/2021    1:40 PM  Fall Risk   Falls in the past year? 0 0 0 0 0  Number falls in past yr:  0 0  0  Injury with Fall?  0 0  0  Risk for fall  due to : No Fall Risks No Fall Risks No Fall Risks No Fall Risks No Fall Risks  Follow up Falls prevention discussed;Education provided;Falls evaluation completed Education provided;Falls prevention discussed Falls prevention discussed Falls prevention discussed Falls prevention discussed      Functional Status Survey: Is the patient deaf or have difficulty hearing?: No Does the patient have difficulty seeing, even when wearing glasses/contacts?:  No Does the patient have difficulty concentrating, remembering, or making decisions?: No Does the patient have difficulty walking or climbing stairs?: No Does the patient have difficulty dressing or bathing?: No Does the patient have difficulty doing errands alone such as visiting a doctor's office or shopping?: No    Assessment & Plan  1. Atherosclerosis of aorta (HCC)  Refuses statin therapy   2. Senile purpura (HCC)  Reassurance given   3. Centrilobular emphysema (HCC)  Refuses therapy   4. Obesity   Losing some weight   5. Long-term use of high-risk medication  - COMPLETE METABOLIC PANEL WITH GFR  6. Vitamin D deficiency  - VITAMIN D 25 Hydroxy (Vit-D Deficiency, Fractures)  7. Low vitamin B12 level  - CBC with Differential/Platelet - B12 and Folate Panel  8. Primary osteoarthritis of both knees  Stable   9. Peripheral polyneuropathy  Recheck B12 and folate  10. Osteopenia after menopause  - DG Bone Density; Future

## 2023-03-13 ENCOUNTER — Encounter: Payer: Self-pay | Admitting: Family Medicine

## 2023-03-13 ENCOUNTER — Ambulatory Visit (INDEPENDENT_AMBULATORY_CARE_PROVIDER_SITE_OTHER): Payer: 59 | Admitting: Family Medicine

## 2023-03-13 VITALS — BP 118/76 | HR 91 | Temp 97.5°F | Resp 16 | Ht 60.0 in | Wt 174.7 lb

## 2023-03-13 DIAGNOSIS — I700 Atherosclerosis of aorta: Secondary | ICD-10-CM | POA: Diagnosis not present

## 2023-03-13 DIAGNOSIS — E559 Vitamin D deficiency, unspecified: Secondary | ICD-10-CM

## 2023-03-13 DIAGNOSIS — G629 Polyneuropathy, unspecified: Secondary | ICD-10-CM

## 2023-03-13 DIAGNOSIS — M170 Bilateral primary osteoarthritis of knee: Secondary | ICD-10-CM | POA: Diagnosis not present

## 2023-03-13 DIAGNOSIS — E669 Obesity, unspecified: Secondary | ICD-10-CM

## 2023-03-13 DIAGNOSIS — Z780 Asymptomatic menopausal state: Secondary | ICD-10-CM | POA: Diagnosis not present

## 2023-03-13 DIAGNOSIS — Z79899 Other long term (current) drug therapy: Secondary | ICD-10-CM | POA: Diagnosis not present

## 2023-03-13 DIAGNOSIS — M8580 Other specified disorders of bone density and structure, unspecified site: Secondary | ICD-10-CM

## 2023-03-13 DIAGNOSIS — J432 Centrilobular emphysema: Secondary | ICD-10-CM | POA: Diagnosis not present

## 2023-03-13 DIAGNOSIS — R7989 Other specified abnormal findings of blood chemistry: Secondary | ICD-10-CM

## 2023-03-13 DIAGNOSIS — D692 Other nonthrombocytopenic purpura: Secondary | ICD-10-CM | POA: Diagnosis not present

## 2023-03-14 LAB — CBC WITH DIFFERENTIAL/PLATELET
Absolute Monocytes: 421 {cells}/uL (ref 200–950)
Basophils Absolute: 32 {cells}/uL (ref 0–200)
Basophils Relative: 0.6 %
Eosinophils Absolute: 49 {cells}/uL (ref 15–500)
Eosinophils Relative: 0.9 %
HCT: 39.7 % (ref 35.0–45.0)
Hemoglobin: 12.8 g/dL (ref 11.7–15.5)
Lymphs Abs: 1868 {cells}/uL (ref 850–3900)
MCH: 28.3 pg (ref 27.0–33.0)
MCHC: 32.2 g/dL (ref 32.0–36.0)
MCV: 87.8 fL (ref 80.0–100.0)
MPV: 10.9 fL (ref 7.5–12.5)
Monocytes Relative: 7.8 %
Neutro Abs: 3029 {cells}/uL (ref 1500–7800)
Neutrophils Relative %: 56.1 %
Platelets: 259 10*3/uL (ref 140–400)
RBC: 4.52 10*6/uL (ref 3.80–5.10)
RDW: 13.1 % (ref 11.0–15.0)
Total Lymphocyte: 34.6 %
WBC: 5.4 10*3/uL (ref 3.8–10.8)

## 2023-03-14 LAB — COMPLETE METABOLIC PANEL WITH GFR
AG Ratio: 1.4 (calc) (ref 1.0–2.5)
ALT: 11 U/L (ref 6–29)
AST: 15 U/L (ref 10–35)
Albumin: 3.9 g/dL (ref 3.6–5.1)
Alkaline phosphatase (APISO): 52 U/L (ref 37–153)
BUN: 16 mg/dL (ref 7–25)
CO2: 24 mmol/L (ref 20–32)
Calcium: 9.2 mg/dL (ref 8.6–10.4)
Chloride: 109 mmol/L (ref 98–110)
Creat: 0.88 mg/dL (ref 0.60–1.00)
Globulin: 2.7 g/dL (calc) (ref 1.9–3.7)
Glucose, Bld: 90 mg/dL (ref 65–99)
Potassium: 4.3 mmol/L (ref 3.5–5.3)
Sodium: 142 mmol/L (ref 135–146)
Total Bilirubin: 0.4 mg/dL (ref 0.2–1.2)
Total Protein: 6.6 g/dL (ref 6.1–8.1)
eGFR: 67 mL/min/{1.73_m2} (ref >=60)

## 2023-03-14 LAB — B12 AND FOLATE PANEL
Folate: 11.5 ng/mL
Vitamin B-12: 329 pg/mL (ref 200–1100)

## 2023-03-14 LAB — VITAMIN D 25 HYDROXY (VIT D DEFICIENCY, FRACTURES): Vit D, 25-Hydroxy: 58 ng/mL (ref 30–100)

## 2023-05-21 ENCOUNTER — Ambulatory Visit (INDEPENDENT_AMBULATORY_CARE_PROVIDER_SITE_OTHER): Payer: 59 | Admitting: Physician Assistant

## 2023-05-21 ENCOUNTER — Encounter: Payer: Self-pay | Admitting: Physician Assistant

## 2023-05-21 VITALS — BP 122/76 | HR 96 | Temp 97.1°F | Resp 16 | Ht 60.0 in | Wt 177.0 lb

## 2023-05-21 DIAGNOSIS — L03114 Cellulitis of left upper limb: Secondary | ICD-10-CM | POA: Diagnosis not present

## 2023-05-21 MED ORDER — SULFAMETHOXAZOLE-TRIMETHOPRIM 800-160 MG PO TABS: 1.0000 | ORAL_TABLET | Freq: Two times a day (BID) | ORAL | 0 refills | Status: AC

## 2023-05-21 NOTE — Progress Notes (Signed)
Acute Office Visit   Patient: Heather Norris   DOB: 1945/07/14   77 y.o. Female  MRN: [id number] Visit Date: 05/21/2023  Today's healthcare provider: Oswaldo Conroy Jamekia Gannett, PA-C  Introduced myself to the patient as a Secondary school teacher and provided education on APPs in clinical practice.    Chief Complaint  Patient presents with   Pain   Edema    Left forearm, noticed it Tuesday, red but not itches   Subjective    HPI HPI     Edema    Additional comments: Left forearm, noticed it Tuesday, red but not itches      Last edited by Forde Radon, CMA on 05/21/2023  1:55 PM.      Left arm pain   Onset: sudden  Duration: started Tuesday  Location: left wrist and forearm  Radiation: none  She denies known trauma or injuries  Reports she went to [place] this past weekend  Pain level and character: 8/10 sharp when she supinates  Other associated symptoms: red rash with dry skin, mild swelling compared to right wrist and arm  Interventions: pain relievers but this did not help much     Medications: Outpatient Medications Prior to Visit  Medication Sig   Calcium-Magnesium-Zinc 333-133-5 MG TABS Take 3 tablets by mouth daily.   esomeprazole (NEXIUM) 20 MG capsule Take 20 mg by mouth daily at 12 noon.   vitamin B-12 (CYANOCOBALAMIN) 500 MCG tablet Take 1,000 mcg by mouth daily.    vitamin C (ASCORBIC ACID) 250 MG tablet Take 500 mg by mouth daily.   No facility-administered medications prior to visit.    Review of Systems  Skin:  Positive for rash.        Objective    BP 122/76   Pulse 96   Temp (!) 97.1 F (36.2 C) (Temporal)   Resp 16   Ht 5' (1.524 m)   Wt 177 lb (80.3 kg)   SpO2 98%   BMI 34.57 kg/m     Physical Exam Vitals reviewed.  Constitutional:      General: She is awake.     Appearance: Normal appearance. She is well-developed and well-groomed.  HENT:     Head: Normocephalic and atraumatic.  Pulmonary:     Effort: Pulmonary effort is  normal.  Musculoskeletal:     Cervical back: Normal range of motion.  Skin:    General: Skin is warm and dry.       Neurological:     Mental Status: She is alert.  Psychiatric:        Mood and Affect: Mood normal.        Behavior: Behavior normal. Behavior is cooperative.        Thought Content: Thought content normal.        Judgment: Judgment normal.       No results found for any visits on 05/21/23.  Assessment & Plan      No follow-ups on file.      Problem List Items Addressed This Visit   None Visit Diagnoses     Cellulitis of left upper extremity    -  Primary   Relevant Medications   sulfamethoxazole-trimethoprim (BACTRIM DS) 800-160 MG tablet      Acute, new concern She reports new onset of erythema, mild swelling, increased warmth along left wrist and forearm for 2 days that is not improving  Physical exam is consistent with cellulitis - will start Bactrim  for management PO BID x 7 days  She denies previous hx of abx- induced yeast infection so no diflucan indicated today  Reviewed ED and return precautions Follow up as needed for persistent or progressing symptoms     No follow-ups on file.   I, Phung Kotas E Jannelle Notaro, PA-C, have reviewed all documentation for this visit. The documentation on 05/21/23 for the exam, diagnosis, procedures, and orders are all accurate and complete.   Jacquelin Hawking, MHS, PA-C [facility]

## 2023-05-21 NOTE — Patient Instructions (Signed)
I believe you have a skin infection called cellulitis  This is usually responsive to antibiotics - I have sent in a script for an antibiotic called Bactrim for you to take by mouth twice per day  If you start to have an allergic reaction while taking it (trouble breathing, rash, tongue swelling) please stop and call the office or go to the ED for more serious symptoms Keep the area clean and dry with warm water and gentle soap You can use gentle moisturizers such as Aquaphor or Vaseline to help with keeping the skin from cracking  Please call our office for any questions or concerns

## 2023-09-17 ENCOUNTER — Ambulatory Visit: Payer: 59

## 2023-09-17 VITALS — BP 124/80 | Ht 60.0 in | Wt 171.2 lb

## 2023-09-17 DIAGNOSIS — Z0000 Encounter for general adult medical examination without abnormal findings: Secondary | ICD-10-CM

## 2023-09-17 NOTE — Progress Notes (Signed)
 Subjective:   ALEXIUS ELLINGTON is a 78 y.o. who presents for a Medicare Wellness preventive visit.  Visit Complete: In person   Persons Participating in Visit: Patient.  AWV Questionnaire: No: Patient Medicare AWV questionnaire was not completed prior to this visit.  Cardiac Risk Factors include: advanced age (>48men, >38 women);obesity (BMI >30kg/m2);sedentary lifestyle     Objective:    Today's Vitals   09/17/23 1538  BP: 124/80  Weight: 171 lb 3.2 oz (77.7 kg)  Height: 5' (1.524 m)   Body mass index is 33.44 kg/m.     09/17/2023    3:50 PM 09/11/2022    3:24 PM 09/05/2021    1:40 PM 09/04/2020    1:53 PM 01/25/2020    8:09 AM 01/04/2020    9:17 AM 08/17/2018   10:52 AM  Advanced Directives  Does Patient Have a Medical Advance Directive? Yes Yes Yes Yes Yes Yes No  Type of Estate agent of [place];Living will  Healthcare Power of [place];Living will Healthcare Power of [place];Living will Healthcare Power of [place];Living will Healthcare Power of Attorney   Does patient want to make changes to medical advance directive? No - Patient declined    No - Patient declined No - Patient declined   Copy of Healthcare Power of Attorney in Chart? Yes - validated most recent copy scanned in chart (See row information)  Yes - validated most recent copy scanned in chart (See row information) Yes - validated most recent copy scanned in chart (See row information) Yes - validated most recent copy scanned in chart (See row information) No - copy requested   Would patient like information on creating a medical advance directive?       Yes (MAU/Ambulatory/Procedural Areas - Information given)    Current Medications (verified) Outpatient Encounter Medications as of 09/17/2023  Medication Sig   Calcium-Magnesium-Zinc 333-133-5 MG TABS Take 3 tablets by mouth daily.   Misc Natural Products (TURMERIC CURCUMIN) CAPS Take 500 capsules by mouth daily.   omeprazole (PRILOSEC  OTC) 20 MG tablet Take 20 mg by mouth daily.   vitamin B-12 (CYANOCOBALAMIN) 500 MCG tablet Take 1,000 mcg by mouth daily.    vitamin C (ASCORBIC ACID) 250 MG tablet Take 500 mg by mouth daily.   esomeprazole (NEXIUM) 20 MG capsule Take 20 mg by mouth daily at 12 noon. (Patient not taking: Reported on 09/17/2023)   No facility-administered encounter medications on file as of 09/17/2023.    Allergies (verified) Patient has no known allergies.   History: Past Medical History:  Diagnosis Date   Arthritis    knees   GERD (gastroesophageal reflux disease)    Osteopenia    Tinnitus of left ear    Past Surgical History:  Procedure Laterality Date   CATARACT EXTRACTION W/PHACO Left 01/04/2020   Procedure: CATARACT EXTRACTION PHACO AND INTRAOCULAR LENS PLACEMENT (IOC) LEFT 6.43 00:56.9 11.3%;  Surgeon: Lockie Mola, MD;  Location: [facility];  Service: Ophthalmology;  Laterality: Left;   CATARACT EXTRACTION W/PHACO Right 01/25/2020   Procedure: CATARACT EXTRACTION PHACO AND INTRAOCULAR LENS PLACEMENT (IOC) RIGHT 7.47 01:00.0 12.5%;  Surgeon: Lockie Mola, MD;  Location: [facility];  Service: Ophthalmology;  Laterality: Right;   CHOLECYSTECTOMY     COLONOSCOPY  06/25/2011   GASTRIC BYPASS  1978   Family History  Adopted: Yes  Problem Relation Age of Onset   Mental illness Daughter        anxiety    Social History  Socioeconomic History   Marital status: Widowed    Spouse name: Greggory Stallion   Number of children: 1   Years of education: Not on file   Highest education level: 7th grade  Occupational History   Occupation: Retired  Tobacco Use   Smoking status: Former    Current packs/day: 0.00    Average packs/day: 2.0 packs/day for 55.4 years (110.8 ttl pk-yrs)    Types: Cigarettes    Start date: 07/07/1956    Quit date: 12/06/2011    Years since quitting: 11.7   Smokeless tobacco: Never  Vaping Use   Vaping status: Never Used  Substance and Sexual  Activity   Alcohol use: No   Drug use: No   Sexual activity: Not Currently  Other Topics Concern   Not on file  Social History Narrative   She has been a widow since 1989   She has not dated since.    She has one child, Annabelle Harman.   Pt lives with a roommate.    Social Drivers of Corporate investment banker Strain: Low Risk  (09/17/2023)   Overall Financial Resource Strain (CARDIA)    Difficulty of Paying Living Expenses: Not hard at all  Food Insecurity: No Food Insecurity (09/17/2023)   Hunger Vital Sign    Worried About Running Out of Food in the Last Year: Never true    Ran Out of Food in the Last Year: Never true  Transportation Needs: No Transportation Needs (09/17/2023)   PRAPARE - Administrator, Civil Service (Medical): No    Lack of Transportation (Non-Medical): No  Physical Activity: Inactive (09/17/2023)   Exercise Vital Sign    Days of Exercise per Week: 0 days    Minutes of Exercise per Session: 0 min  Stress: No Stress Concern Present (09/17/2023)   [employer] of Occupational Health - Occupational Stress Questionnaire    Feeling of Stress : Not at all  Social Connections: Socially Isolated (09/17/2023)   Social Connection and Isolation Panel [NHANES]    Frequency of Communication with Friends and Family: Once a week    Frequency of Social Gatherings with Friends and Family: Never    Attends Religious Services: More than 4 times per year    Active Member of [employer] or Organizations: No    Attends Banker Meetings: Never    Marital Status: Widowed    Tobacco Counseling Counseling given: Not Answered    Clinical Intake:  Pre-visit preparation completed: Yes  Pain : No/denies pain     BMI - recorded: 33.44 Nutritional Status: BMI > 30  Obese Nutritional Risks: None Diabetes: No  How often do you need to have someone help you when you read instructions, pamphlets, or other written materials from your doctor or pharmacy?: 1 -  Never  Interpreter Needed?: No  Information entered by :: Kennedy Bucker, LPN   Activities of Daily Living     09/17/2023    3:51 PM 05/21/2023    1:47 PM  In your present state of health, do you have any difficulty performing the following activities:  Hearing? 0 0  Vision? 0 0  Difficulty concentrating or making decisions? 0 0  Walking or climbing stairs? 1 0  Comment GO SLOW   Dressing or bathing? 0 0  Doing errands, shopping? 0 0  Preparing Food and eating ? N   Using the Toilet? N   In the past six months, have you accidently leaked urine? N  Do you have problems with loss of bowel control? N   Managing your Medications? N   Managing your Finances? N   Housekeeping or managing your Housekeeping? N     Patient Care Team: Alba Cory, MD as PCP - General (Family Medicine) Pa, [facility] (Optometry)  Indicate any recent Medical Services you may have received from other than [facility] providers in the past year (date may be approximate).     Assessment:   This is a routine wellness examination for [place].  Hearing/Vision screen Hearing Screening - Comments:: NO AIDS Vision Screening - Comments:: READERS, HAD CATARACT SGY- [facility]   Goals Addressed             This Visit's Progress    DIET - EAT MORE FRUITS AND VEGETABLES         Depression Screen     09/17/2023    3:47 PM 05/21/2023    1:47 PM 03/13/2023   11:27 AM 09/11/2022    3:18 PM 03/11/2022   11:41 AM 11/22/2021    3:54 PM 09/05/2021    1:38 PM  PHQ 2/9 Scores  PHQ - 2 Score 0 0 0 0 0 0 0  PHQ- 9 Score 0 0 0  0 0 0    Fall Risk     09/17/2023    3:51 PM 05/21/2023    1:47 PM 03/13/2023   11:27 AM 09/11/2022    3:16 PM 03/11/2022   11:41 AM  Fall Risk   Falls in the past year? 0 0 0 0 0  Number falls in past yr: 0 0  0 0  Injury with Fall? 0 0  0 0  Risk for fall due to : No Fall Risks No Fall Risks No Fall Risks No Fall Risks No Fall Risks  Follow up Falls prevention discussed;Falls  evaluation completed Falls prevention discussed;Education provided;Falls evaluation completed Falls prevention discussed;Education provided;Falls evaluation completed Education provided;Falls prevention discussed Falls prevention discussed    MEDICARE RISK AT HOME:  Medicare Risk at Home Any stairs in or around the home?: No If so, are there any without handrails?: No Home free of loose throw rugs in walkways, pet beds, electrical cords, etc?: Yes Adequate lighting in your home to reduce risk of falls?: Yes Life alert?: No Use of a cane, walker or w/c?: No Grab bars in the bathroom?: Yes Shower chair or bench in shower?: No Elevated toilet seat or a handicapped toilet?: Yes  TIMED UP AND GO:  Was the test performed?  Yes  Length of time to ambulate 10 feet: 4 sec Gait steady and fast without use of assistive device  Cognitive Function: 6CIT completed        09/17/2023    3:52 PM 09/11/2022    3:26 PM 09/04/2020    1:55 PM 09/01/2019    1:36 PM 08/17/2018   10:59 AM  6CIT Screen  What Year? 0 points 0 points 0 points 0 points 0 points  What month? 0 points 0 points 0 points 0 points 0 points  What time? 0 points 0 points 0 points 0 points 0 points  Count back from 20 0 points 0 points 0 points 0 points 0 points  Months in reverse 0 points 0 points 0 points 0 points 2 points  Repeat phrase 0 points 2 points 0 points 2 points 0 points  Total Score 0 points 2 points 0 points 2 points 2 points    Immunizations Immunization History  Administered Date(s) Administered   Influenza,inj,Quad PF,6+ Mos 03/08/2020   Janssen (J&J) SARS-COV-2 Vaccination 01/02/2020   Pneumococcal Conjugate-13 08/13/2016   Pneumococcal Polysaccharide-23 06/11/2011   Td 06/11/2011   Tdap 03/08/2021    Screening Tests Health Maintenance  Topic Date Due   Zoster Vaccines- Shingrix (1 of 2) Never done   Lung Cancer Screening  09/11/2018   COVID-19 Vaccine (2 - Janssen risk series) 01/30/2020   DEXA  SCAN  09/23/2021   INFLUENZA VACCINE  10/05/2023 (Originally 02/05/2023)   Medicare Annual Wellness (AWV)  09/16/2024   DTaP/Tdap/Td (3 - Td or Tdap) 03/09/2031   Pneumonia Vaccine 9+ Years old  Completed   Hepatitis C Screening  Completed   HPV VACCINES  Aged Out   MAMMOGRAM  Discontinued   Colonoscopy  Discontinued    Health Maintenance  Health Maintenance Due  Topic Date Due   Zoster Vaccines- Shingrix (1 of 2) Never done   Lung Cancer Screening  09/11/2018   COVID-19 Vaccine (2 - Janssen risk series) 01/30/2020   DEXA SCAN  09/23/2021   Health Maintenance Items Addressed: DECLINES REFERRAL FOR BDS  Additional Screening:  Vision Screening: Recommended annual ophthalmology exams for early detection of glaucoma and other disorders of the eye.  Dental Screening: Recommended annual dental exams for proper oral hygiene  Community Resource Referral / Chronic Care Management: CRR required this visit?  No   CCM required this visit?  No     Plan:     I have personally reviewed and noted the following in the patient's chart:   Medical and social history Use of alcohol, tobacco or illicit drugs  Current medications and supplements including opioid prescriptions. Patient is not currently taking opioid prescriptions. Functional ability and status Nutritional status Physical activity Advanced directives List of other physicians Hospitalizations, surgeries, and ER visits in previous 12 months Vitals Screenings to include cognitive, depression, and falls Referrals and appointments  In addition, I have reviewed and discussed with patient certain preventive protocols, quality metrics, and best practice recommendations. A written personalized care plan for preventive services as well as general preventive health recommendations were provided to patient.     Hal Hope, LPN   1/61/0960   After Visit Summary: (In Person-Declined) Patient declined AVS at this  time.  Notes: Nothing significant to report at this time.

## 2023-09-17 NOTE — Patient Instructions (Addendum)
 Heather Norris , Thank you for taking time to come for your Medicare Wellness Visit. I appreciate your ongoing commitment to your health goals. Please review the following plan we discussed and let me know if I can assist you in the future.   Referrals/Orders/Follow-Ups/Clinician Recommendations: NONE  This is a list of the screening recommended for you and due dates:  Health Maintenance  Topic Date Due   Zoster (Shingles) Vaccine (1 of 2) Never done   Screening for Lung Cancer  09/11/2018   COVID-19 Vaccine (2 - Janssen risk series) 01/30/2020   DEXA scan (bone density measurement)  09/23/2021   Flu Shot  10/05/2023*   Medicare Annual Wellness Visit  09/16/2024   DTaP/Tdap/Td vaccine (3 - Td or Tdap) 03/09/2031   Pneumonia Vaccine  Completed   Hepatitis C Screening  Completed   HPV Vaccine  Aged Out   Mammogram  Discontinued   Colon Cancer Screening  Discontinued  *Topic was postponed. The date shown is not the original due date.    Advanced directives: (In Chart) A copy of your advanced directives are scanned into your chart should your provider ever need it.  Next Medicare Annual Wellness Visit scheduled for next year: Yes   09/22/24 @ 3:50 PM IN PERSON

## 2024-03-15 ENCOUNTER — Ambulatory Visit: Payer: 59 | Admitting: Family Medicine

## 2024-03-23 ENCOUNTER — Encounter: Payer: Self-pay | Admitting: Family Medicine

## 2024-03-23 ENCOUNTER — Ambulatory Visit (INDEPENDENT_AMBULATORY_CARE_PROVIDER_SITE_OTHER): Payer: Self-pay | Admitting: Family Medicine

## 2024-03-23 VITALS — BP 116/74 | HR 78 | Resp 16 | Ht 60.0 in | Wt 174.8 lb

## 2024-03-23 DIAGNOSIS — Z780 Asymptomatic menopausal state: Secondary | ICD-10-CM

## 2024-03-23 DIAGNOSIS — E049 Nontoxic goiter, unspecified: Secondary | ICD-10-CM

## 2024-03-23 DIAGNOSIS — R7989 Other specified abnormal findings of blood chemistry: Secondary | ICD-10-CM | POA: Diagnosis not present

## 2024-03-23 DIAGNOSIS — I700 Atherosclerosis of aorta: Secondary | ICD-10-CM

## 2024-03-23 DIAGNOSIS — G629 Polyneuropathy, unspecified: Secondary | ICD-10-CM | POA: Diagnosis not present

## 2024-03-23 DIAGNOSIS — M8580 Other specified disorders of bone density and structure, unspecified site: Secondary | ICD-10-CM | POA: Diagnosis not present

## 2024-03-23 DIAGNOSIS — J432 Centrilobular emphysema: Secondary | ICD-10-CM

## 2024-03-23 DIAGNOSIS — M170 Bilateral primary osteoarthritis of knee: Secondary | ICD-10-CM | POA: Diagnosis not present

## 2024-03-23 NOTE — Progress Notes (Signed)
 Name: Heather Norris   MRN: [id number]    DOB: 1946/01/22   Date:03/23/2024       Progress Note  Subjective  Chief Complaint  Chief Complaint  Patient presents with   Medical Management of Chronic Issues   Discussed the use of AI scribe software for clinical note transcription with the patient, who gave verbal consent to proceed.  History of Present Illness Heather Norris is an 78 year old female who presents for a regular follow-up visit.  She experiences intermittent knee pain due to osteoarthritis, which worsens in cold weather but does not affect her ability to walk or perform household activities. She takes Tylenol  650 mg  as needed when experiencing pain.  She has gastroesophageal reflux disease (GERD) and currently uses omeprazole as needed. No heartburn, indigestion, or difficulty swallowing. Her weight has remained stable.  She has degenerative disc disease of the cervical spine and spinal stenosis. She avoids looking down to prevent neck pain and reports no tingling, numbness, or dropping objects.   She has a history of emphysema, identified on a CT scan, and experiences a little bit of shortness of breath when walking. She quit smoking many years ago after a long history of smoking.  She has aortic atherosclerosis and an enlarged heart noted on a CT scan. She does not take cholesterol medication by choice and not interested in starting it  She experiences peripheral neuropathy with numbness in her toes, described as 'when your foot goes to sleep and it wakes up.' She has a B12 deficiency but is not currently taking B12 supplements.  She has a history of a slightly enlarged thyroid, described as a goiter, identified on a CT scan. She has not pursued further evaluation or treatment.  No falls and maintains good balance. She is socially active, attending church regularly, and has support from her church community. She travels to [place]  annually and prefers warmer climates.  [place], and she has a grandson nearby.    Patient Active Problem List   Diagnosis Date Noted   Centrilobular emphysema (HCC) 11/22/2021   Low vitamin B12 level 11/22/2021   Atherosclerosis of aorta (HCC) 01/27/2019   Senile purpura (HCC) 01/27/2019   Osteopenia 09/25/2016   GERD (gastroesophageal reflux disease) 08/13/2016   Tinnitus of left ear 08/13/2016   History of bariatric surgery 08/13/2016   Vitamin D  deficiency 08/13/2016   History of shoulder fracture 08/13/2016   Osteoarthritis of both knees 08/13/2016   Insulin  resistance 08/13/2016    Past Surgical History:  Procedure Laterality Date   CATARACT EXTRACTION W/PHACO Left 01/04/2020   Procedure: CATARACT EXTRACTION PHACO AND INTRAOCULAR LENS PLACEMENT (IOC) LEFT 6.43 00:56.9 11.3%;  Surgeon: Mittie Gaskin, MD;  Location: [facility];  Service: Ophthalmology;  Laterality: Left;   CATARACT EXTRACTION W/PHACO Right 01/25/2020   Procedure: CATARACT EXTRACTION PHACO AND INTRAOCULAR LENS PLACEMENT (IOC) RIGHT 7.47 01:00.0 12.5%;  Surgeon: Mittie Gaskin, MD;  Location: [facility];  Service: Ophthalmology;  Laterality: Right;   CHOLECYSTECTOMY     COLONOSCOPY  06/25/2011   GASTRIC BYPASS  1978    Family History  Adopted: Yes  Problem Relation Age of Onset   Mental illness Daughter        anxiety     Social History   Tobacco Use   Smoking status: Former    Current packs/day: 0.00    Average packs/day: 2.0 packs/day for 55.4 years (110.8 ttl pk-yrs)    Types:  Cigarettes    Start date: 07/07/1956    Quit date: 12/06/2011    Years since quitting: 12.3   Smokeless tobacco: Never  Substance Use Topics   Alcohol use: No     Current Outpatient Medications:    Calcium -Magnesium-Zinc 333-133-5 MG TABS, Take 3 tablets by mouth daily., Disp: , Rfl:    esomeprazole (NEXIUM) 20 MG capsule, Take 20 mg by mouth daily at 12 noon., Disp: , Rfl:    Misc Natural Products  (TURMERIC CURCUMIN) CAPS, Take 500 capsules by mouth daily., Disp: , Rfl:    omeprazole (PRILOSEC OTC) 20 MG tablet, Take 20 mg by mouth daily., Disp: , Rfl:    vitamin B-12 (CYANOCOBALAMIN ) 500 MCG tablet, Take 1,000 mcg by mouth daily. , Disp: , Rfl:    vitamin C (ASCORBIC ACID) 250 MG tablet, Take 500 mg by mouth daily., Disp: , Rfl:   No Known Allergies  I personally reviewed active problem list, medication list, allergies, family history with the patient/caregiver today.   ROS  Ten systems reviewed and is negative except as mentioned in HPI    Objective Physical Exam CONSTITUTIONAL: Patient appears well-developed and well-nourished. No distress. HEENT: Head atraumatic, normocephalic, neck supple. CARDIOVASCULAR: Normal rate, regular rhythm and normal heart sounds. No murmur heard. No BLE edema. PULMONARY: Effort normal and breath sounds normal. No respiratory distress. ABDOMINAL: There is no tenderness or distention. MUSCULOSKELETAL: Normal gait. Without gross motor or sensory deficit. PSYCHIATRIC: Patient has a normal mood and affect. Behavior is normal. Judgment and thought content normal. SKIN: Lesion on eyebrow, present for a prolonged period.  Vitals:   03/23/24 1518  BP: 116/74  Pulse: 78  Resp: 16  SpO2: 96%  Weight: 174 lb 12.8 oz (79.3 kg)  Height: 5' (1.524 m)    Body mass index is 34.14 kg/m.     PHQ2/9:    03/23/2024    3:14 PM 09/17/2023    3:47 PM 05/21/2023    1:47 PM 03/13/2023   11:27 AM 09/11/2022    3:18 PM  Depression screen PHQ 2/9  Decreased Interest 0 0 0 0 0  Down, Depressed, Hopeless 0 0 0 0 0  PHQ - 2 Score 0 0 0 0 0  Altered sleeping  0 0 0   Tired, decreased energy  0 0 0   Change in appetite  0 0 0   Feeling bad or failure about yourself   0 0 0   Trouble concentrating  0 0 0   Moving slowly or fidgety/restless  0 0 0   Suicidal thoughts  0 0 0   PHQ-9 Score  0 0 0   Difficult doing work/chores  Not difficult at all Not difficult  at all      phq 9 is negative  Fall Risk:    03/23/2024    3:14 PM 09/17/2023    3:51 PM 05/21/2023    1:47 PM 03/13/2023   11:27 AM 09/11/2022    3:16 PM  Fall Risk   Falls in the past year? 0 0 0 0 0  Number falls in past yr: 0 0 0  0  Injury with Fall? 0 0 0  0  Risk for fall due to : No Fall Risks No Fall Risks No Fall Risks No Fall Risks No Fall Risks  Follow up Falls evaluation completed Falls prevention discussed;Falls evaluation completed Falls prevention discussed;Education provided;Falls evaluation completed Falls prevention discussed;Education provided;Falls evaluation completed Education provided;Falls prevention discussed  Assessment & Plan Possible skin cancer of right eyebrow Persistent lesion on right eyebrow, possibly skin cancer. No previous biopsy or dermatology consultation.  Peripheral neuropathy due to B12 deficiency Persistent numbness in toes. Not taking B12 supplements. - Check B12 levels. - Recommend sublingual B12 supplements or liquid B12 for absorption.  Osteoarthritis of both knees Intermittent mild knee pain, exacerbated by cold weather, not affecting mobility significantly. - Continue current management with arthritis medication as needed for pain relief.  Degenerative disc disease and spinal stenosis of cervical spine Pain managed by posture adjustment.  Mild centrilobular emphysema Mild exertional dyspnea. Long-term smoking history. No additional medication desired.  Gastroesophageal reflux disease (GERD) No current symptoms. Omeprazole taken as needed. - Continue omeprazole as needed for symptom management.  Osteopenia/osteoporosis No recent bone density testing. No interest in medication for bone loss. - Check CBC, sugar, liver function, and vitamin D  levels.  Goiter (diffuse thyroid enlargement) Slightly enlarged thyroid on previous CT. No current symptoms or desire for further testing or treatment.  Aortic atherosclerosis Known  atherosclerosis of the aorta. No interest in cholesterol medication.

## 2024-03-24 ENCOUNTER — Ambulatory Visit: Payer: Self-pay | Admitting: Family Medicine

## 2024-03-24 LAB — CBC WITH DIFFERENTIAL/PLATELET
Absolute Lymphocytes: 1993 {cells}/uL (ref 850–3900)
Absolute Monocytes: 400 {cells}/uL (ref 200–950)
Basophils Absolute: 59 {cells}/uL (ref 0–200)
Basophils Relative: 1.1 %
Eosinophils Absolute: 59 {cells}/uL (ref 15–500)
Eosinophils Relative: 1.1 %
HCT: 40.3 % (ref 35.0–45.0)
Hemoglobin: 13 g/dL (ref 11.7–15.5)
MCH: 28.8 pg (ref 27.0–33.0)
MCHC: 32.3 g/dL (ref 32.0–36.0)
MCV: 89.4 fL (ref 80.0–100.0)
MPV: 11 fL (ref 7.5–12.5)
Monocytes Relative: 7.4 %
Neutro Abs: 2889 {cells}/uL (ref 1500–7800)
Neutrophils Relative %: 53.5 %
Platelets: 249 Thousand/uL (ref 140–400)
RBC: 4.51 Million/uL (ref 3.80–5.10)
RDW: 12.7 % (ref 11.0–15.0)
Total Lymphocyte: 36.9 %
WBC: 5.4 Thousand/uL (ref 3.8–10.8)

## 2024-03-24 LAB — COMPREHENSIVE METABOLIC PANEL WITH GFR
AG Ratio: 1.5 (calc) (ref 1.0–2.5)
ALT: 9 U/L (ref 6–29)
AST: 16 U/L (ref 10–35)
Albumin: 4 g/dL (ref 3.6–5.1)
Alkaline phosphatase (APISO): 57 U/L (ref 37–153)
BUN: 15 mg/dL (ref 7–25)
CO2: 27 mmol/L (ref 20–32)
Calcium: 9.6 mg/dL (ref 8.6–10.4)
Chloride: 108 mmol/L (ref 98–110)
Creat: 0.85 mg/dL (ref 0.60–0.95)
Globulin: 2.7 g/dL (ref 1.9–3.7)
Glucose, Bld: 101 mg/dL — ABNORMAL HIGH (ref 65–99)
Potassium: 4.9 mmol/L (ref 3.5–5.3)
Sodium: 142 mmol/L (ref 135–146)
Total Bilirubin: 0.5 mg/dL (ref 0.2–1.2)
Total Protein: 6.7 g/dL (ref 6.1–8.1)
eGFR: 69 mL/min/1.73m2 (ref >=60)

## 2024-03-24 LAB — B12 AND FOLATE PANEL
Folate: 15.4 ng/mL
Vitamin B-12: 326 pg/mL (ref 200–1100)

## 2024-03-24 LAB — VITAMIN D 25 HYDROXY (VIT D DEFICIENCY, FRACTURES): Vit D, 25-Hydroxy: 48 ng/mL (ref 30–100)

## 2024-09-22 ENCOUNTER — Ambulatory Visit

## 2025-03-24 ENCOUNTER — Ambulatory Visit: Admitting: Family Medicine
